# Patient Record
Sex: Male | Born: 1945 | Race: White | Hispanic: No | Marital: Married | Smoking: Never smoker
Health system: Southern US, Community
[De-identification: ages and names within clinical notes are randomized; demographics above are authoritative.]

## PROBLEM LIST (undated history)

## (undated) DIAGNOSIS — K5790 Diverticulosis of intestine, part unspecified, without perforation or abscess without bleeding: Secondary | ICD-10-CM

## (undated) DIAGNOSIS — I872 Venous insufficiency (chronic) (peripheral): Secondary | ICD-10-CM

## (undated) DIAGNOSIS — M51369 Other intervertebral disc degeneration, lumbar region without mention of lumbar back pain or lower extremity pain: Secondary | ICD-10-CM

## (undated) DIAGNOSIS — E781 Pure hyperglyceridemia: Secondary | ICD-10-CM

## (undated) DIAGNOSIS — N529 Male erectile dysfunction, unspecified: Secondary | ICD-10-CM

## (undated) DIAGNOSIS — Z9989 Dependence on other enabling machines and devices: Secondary | ICD-10-CM

## (undated) DIAGNOSIS — J9600 Acute respiratory failure, unspecified whether with hypoxia or hypercapnia: Secondary | ICD-10-CM

## (undated) DIAGNOSIS — G4733 Obstructive sleep apnea (adult) (pediatric): Secondary | ICD-10-CM

## (undated) DIAGNOSIS — U071 COVID-19: Secondary | ICD-10-CM

## (undated) DIAGNOSIS — E1169 Type 2 diabetes mellitus with other specified complication: Secondary | ICD-10-CM

## (undated) DIAGNOSIS — I509 Heart failure, unspecified: Secondary | ICD-10-CM

## (undated) DIAGNOSIS — R7989 Other specified abnormal findings of blood chemistry: Secondary | ICD-10-CM

## (undated) DIAGNOSIS — M5136 Other intervertebral disc degeneration, lumbar region: Secondary | ICD-10-CM

## (undated) DIAGNOSIS — E7800 Pure hypercholesterolemia, unspecified: Secondary | ICD-10-CM

## (undated) DIAGNOSIS — E669 Obesity, unspecified: Secondary | ICD-10-CM

## (undated) DIAGNOSIS — I10 Essential (primary) hypertension: Secondary | ICD-10-CM

## (undated) DIAGNOSIS — G4730 Sleep apnea, unspecified: Secondary | ICD-10-CM

## (undated) DIAGNOSIS — K319 Disease of stomach and duodenum, unspecified: Secondary | ICD-10-CM

## (undated) HISTORY — DX: Male erectile dysfunction, unspecified: N52.9

## (undated) HISTORY — DX: Other intervertebral disc degeneration, lumbar region without mention of lumbar back pain or lower extremity pain: M51.369

## (undated) HISTORY — DX: Venous insufficiency (chronic) (peripheral): I87.2

## (undated) HISTORY — DX: Disease of stomach and duodenum, unspecified: K31.9

## (undated) HISTORY — DX: Obstructive sleep apnea (adult) (pediatric): G47.33

## (undated) HISTORY — DX: Diverticulosis of intestine, part unspecified, without perforation or abscess without bleeding: K57.90

## (undated) HISTORY — PX: TRANSTHORACIC ECHOCARDIOGRAM: SHX275

## (undated) HISTORY — PX: BACK SURGERY: SHX140

## (undated) HISTORY — DX: COVID-19: U07.1

## (undated) HISTORY — DX: Sleep apnea, unspecified: G47.30

## (undated) HISTORY — DX: Acute respiratory failure, unspecified whether with hypoxia or hypercapnia: J96.00

## (undated) HISTORY — DX: Other intervertebral disc degeneration, lumbar region: M51.36

## (undated) HISTORY — DX: Pure hypercholesterolemia, unspecified: E78.00

## (undated) HISTORY — DX: Essential (primary) hypertension: I10

## (undated) HISTORY — DX: Other specified abnormal findings of blood chemistry: R79.89

## (undated) HISTORY — DX: Pure hyperglyceridemia: E78.1

## (undated) HISTORY — DX: Dependence on other enabling machines and devices: Z99.89

## (undated) HISTORY — DX: Type 2 diabetes mellitus with other specified complication: E11.69

## (undated) HISTORY — DX: Obesity, unspecified: E66.9

---

## 1997-06-12 ENCOUNTER — Encounter (INDEPENDENT_AMBULATORY_CARE_PROVIDER_SITE_OTHER): Payer: Self-pay | Admitting: Gastroenterology

## 1998-05-06 ENCOUNTER — Observation Stay (HOSPITAL_COMMUNITY): Admission: AD | Admit: 1998-05-06 | Discharge: 1998-05-08 | Payer: Self-pay | Admitting: Cardiology

## 2001-03-07 ENCOUNTER — Encounter: Payer: Self-pay | Admitting: Cardiovascular Disease

## 2001-03-07 ENCOUNTER — Ambulatory Visit (HOSPITAL_COMMUNITY): Admission: RE | Admit: 2001-03-07 | Discharge: 2001-03-07 | Payer: Self-pay | Admitting: Cardiovascular Disease

## 2001-10-16 HISTORY — PX: CARDIAC CATHETERIZATION: SHX172

## 2002-07-04 ENCOUNTER — Ambulatory Visit (HOSPITAL_COMMUNITY): Admission: RE | Admit: 2002-07-04 | Discharge: 2002-07-04 | Payer: Self-pay | Admitting: Cardiology

## 2002-08-26 ENCOUNTER — Encounter: Admission: RE | Admit: 2002-08-26 | Discharge: 2002-08-26 | Payer: Self-pay | Admitting: Specialist

## 2002-08-26 ENCOUNTER — Encounter: Payer: Self-pay | Admitting: Specialist

## 2002-09-15 ENCOUNTER — Encounter: Payer: Self-pay | Admitting: Specialist

## 2002-09-17 ENCOUNTER — Encounter: Payer: Self-pay | Admitting: Specialist

## 2002-09-17 ENCOUNTER — Inpatient Hospital Stay (HOSPITAL_COMMUNITY): Admission: RE | Admit: 2002-09-17 | Discharge: 2002-09-20 | Payer: Self-pay | Admitting: Specialist

## 2003-12-23 ENCOUNTER — Emergency Department (HOSPITAL_COMMUNITY): Admission: EM | Admit: 2003-12-23 | Discharge: 2003-12-23 | Payer: Self-pay

## 2003-12-28 ENCOUNTER — Emergency Department (HOSPITAL_COMMUNITY): Admission: EM | Admit: 2003-12-28 | Discharge: 2003-12-28 | Payer: Self-pay | Admitting: Emergency Medicine

## 2004-06-14 IMAGING — CT CT L SPINE W/ CM
2 series · 10 of 14 positions shown, 12 images · IV contrast (omnipaque)
Comparison: none

FINDINGS
CLINICAL DATA: PATIENT WITH LOW BACK PAIN, NUMBNESS IN HIS BUTTOCKS AND PENIS REGION.
LUMBAR MYELOGRAM
INFORMED CONSENT WAS OBTAINED.  USING STERILE TECHNIQUE, I PLACED A 20 GAUGE SPINAL NEEDLE INTO THE
THECAL SAC AT L2.  CSF IS CLEAR.  15 CC OMNIPAQUE 180 CONTRAST WAS INSTILLED.  THE NEEDLE WAS
WITHDRAWN.  MULTIPLE MYELOGRAM IMAGES WERE OBTAINED, SUPPLEMENTED WITH STANDING FLEXION AND
EXTENSION VIEWS, AS WELL AS LATERAL BENDING VIEWS.
THE PATIENT HAS FIVE NON RIB BEARING LUMBAR VERTEBRA.  MULTI LEVEL DEGENERATIVE DISK DISEASE IS
DEMONSTRATED.  AT L2-3, CENTRAL STENOSIS IS DEMONSTRATED WITH POSTERIOR OSSEOUS SPURRING.  THE
STENOSIS DOES DECREASE SLIGHTLY WITH FORWARD FLEXION.  THERE IS SUPERIOR STENOSIS AT L3-4.  THERE
WAS MINIMAL CONTRAST, WHICH EXTENDED CAUDAL TO THE L3-4 DISK LEVEL.  NO APPRECIABLE CONTRAST IS
SEEN CAUDAL TO L4.  ADVANCED DEGENERATIVE DISK DISEASE IS SEEN AT THE L4-5 LEVEL WITH DISK HEIGHT
LOSS AND SUBCHONDRAL SCLEROSIS.  THERE IS NO APPRECIABLE MOTION THROUGH FLEXION AND EXTENSION.
MARKED DISK HEIGHT LOSS IS SEEN AT L5-S1.
IMPRESSION
PATIENT WITH SEVERE SPINAL STENOSIS AT L2-3 AND L3-4.  NO APPRECIABLE CONTRAST EXTENDS CAUDAL TO
THE L3-4 LEVEL.  STENOSIS AT L2-3 DOES REDUCE SLIGHTLY WITH FLEXION.
CT LUMBAR SPINE POST INTRATHECAL CONTRAST
COMPARE MRI EXAMINATION FROM [HOSPITAL] 08/08/02.
AXIAL IMAGING EXTENDS FROM L1 THROUGH S1.
L1-2:  NORMAL.
L2-3:  DIFFUSE CIRCUMFERENTIAL DISK BULGE WITH BROAD CENTRAL PROTRUSION ECCENTRIC RIGHT.  THIS IN
CONJUNCTION WITH FACET OSTEOARTHRITIS AND  LIGAMENTUM FLAVUM HYPERTROPHY RESULTS IN MODERATE
MULTIFACTORIAL CENTRAL STENOSIS.  NO APPRECIABLE ENCROACHMENT UPON EXITING L2 DORSAL ROOT GANGLIA.
L3-4:  DIFFUSE CIRCUMFERENTIAL DISK BULGE WITH BROAD CENTRAL DISK PROTRUSION.  THIS IN CONJUNCTION
WITH FACET OSTEOARTHRITIS LIGAMENTUM FLAVUM HYPERTROPHY RESULTS IN SEVERE MULTIFACTORIAL CENTRAL
STENOSIS.  NO APPRECIABLE SUBARTICULAR RECESS STENOSIS.  THE L3 DORSAL ROOT GANGLIA EXIT WITHOUT
ENCROACHMENT.
L4-5:  CRITICAL CENTRAL STENOSIS IS DEMONSTRATED.  THERE IS DIFFUSE DISK BULGE WITH BROAD CENTRAL
DISK EXTRUSION AND CENTRAL OSSEOUS SPURRING.  ADDITIONALLY, THERE IS ADVANCED FACET OSTEOARTHRITIS
IN THE SUPERIOR FACET BONEY OVERGROWTH RESULTING IN SEVERE SUBARTICULAR RECESS STENOSIS.  FORAMINAL
STENOSIS SEEN BILATERALLY, WHICH IS MULTIFACTORIAL, COMPOUNDED BY NARROWING OF THE FORAMEN IN THE
CRANIOCAUDAL DIMENSION IS THE RESULT OF DISK HEIGHT LOSS.
L5-S1:  MODERATE SEVERE FORAMINAL STENOSIS BILATERALLY SECONDARY TO NARROWING IN THE FORAMEN AND
THE CRANIOCAUDAL DIMENSION, COMPOUNDED BY SUPRA FACET BONEY OVERGROWTH AND DIFFUSED DISK BULGE.
ADDITIONALLY, THERE IS MODERATE TO SEVERE SUBARTICULAR RECESS STENOSIS BILATERALLY, RIGHT GREATER
THAN LEFT.  NO APPRECIABLE CENTRAL STENOSIS.

[Series 3: axials · axial · 0.29mm/px · z∈[-60,+76]mm · 5 of 74 slices shown, 7 images (1 of 2)]
[im 13/74  soft-tissue]
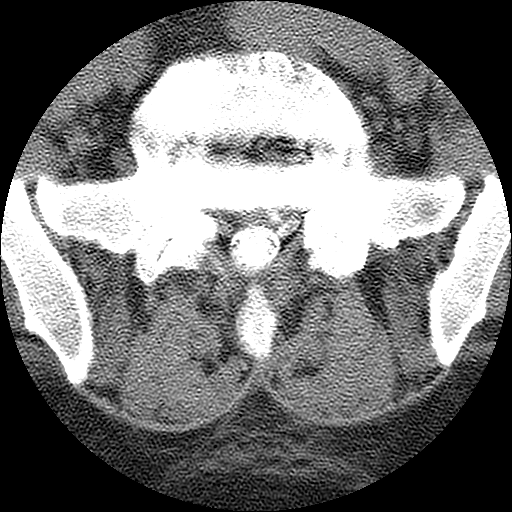
[im 13/74  bone]
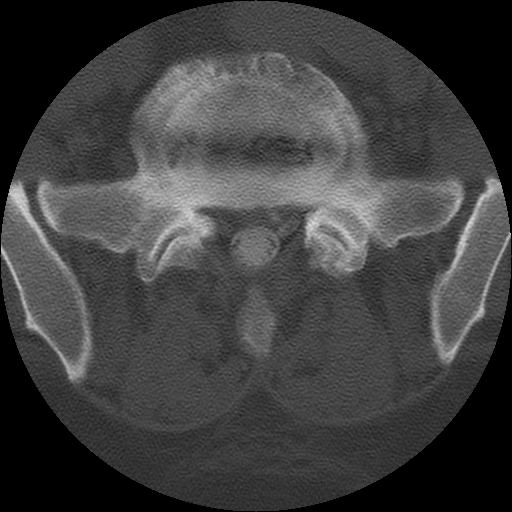
[im 25/74  bone]
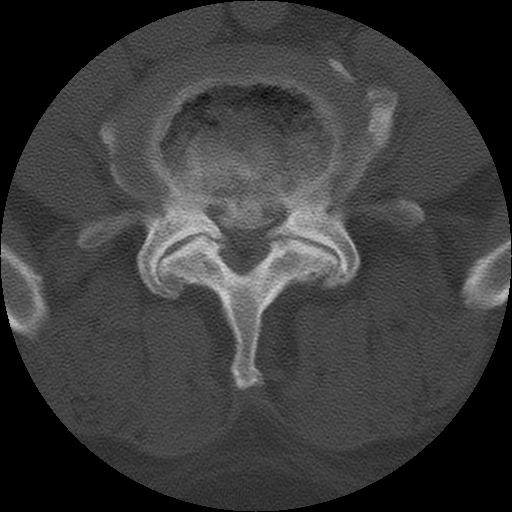
[im 37/74  bone]
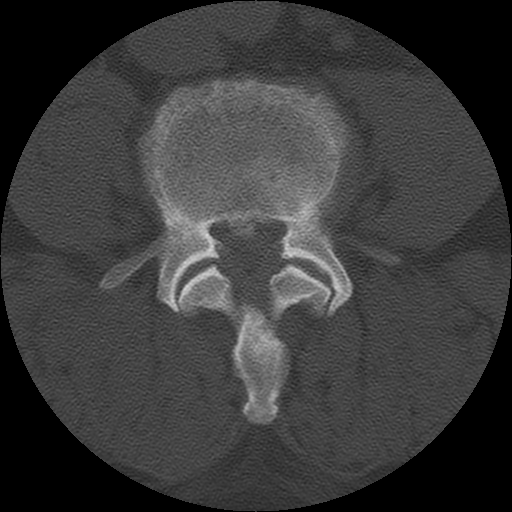
[im 49/74  bone]
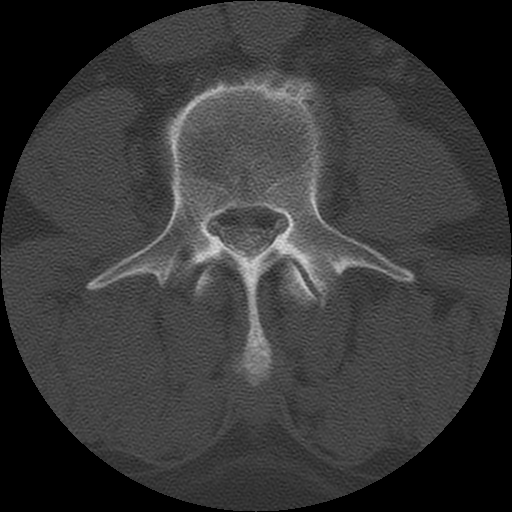
[im 61/74  soft-tissue]
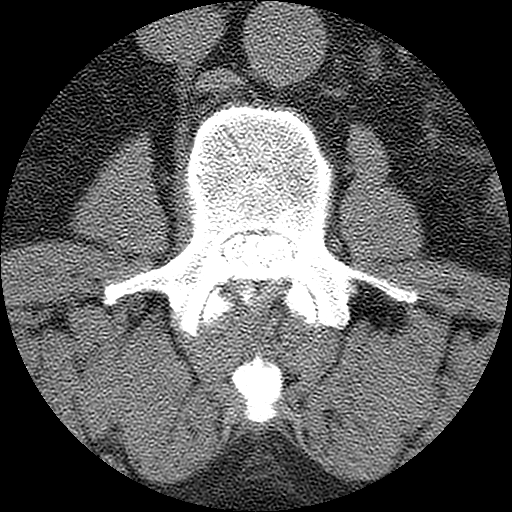
[im 61/74  bone]
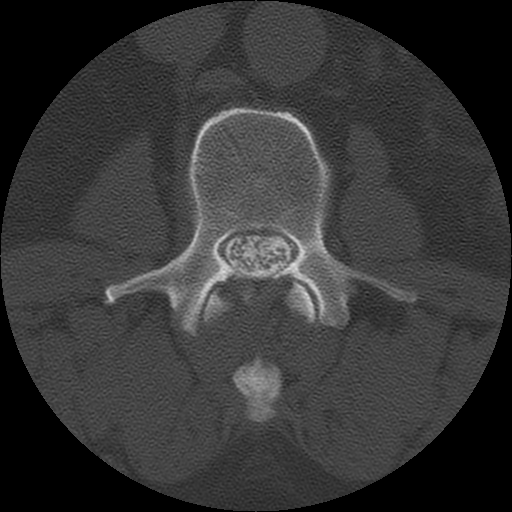

[Series 4: axials · axial · 0.29mm/px · z∈[-60,+76]mm · 5 of 74 slices shown (2 of 2)]
[im 13/74  bone]
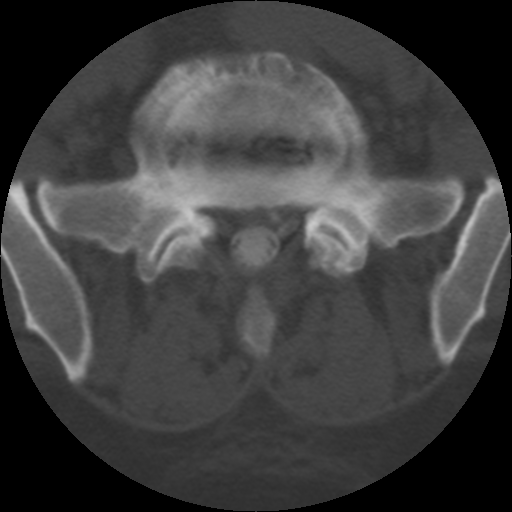
[im 25/74  bone]
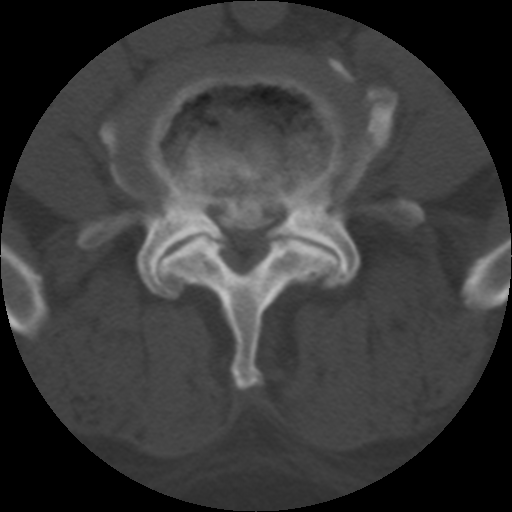
[im 37/74  bone]
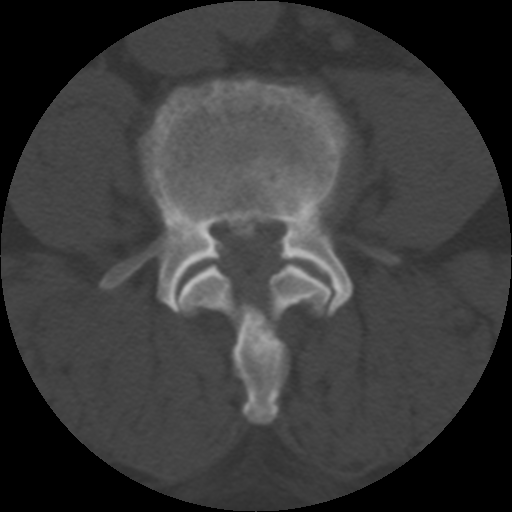
[im 49/74  bone]
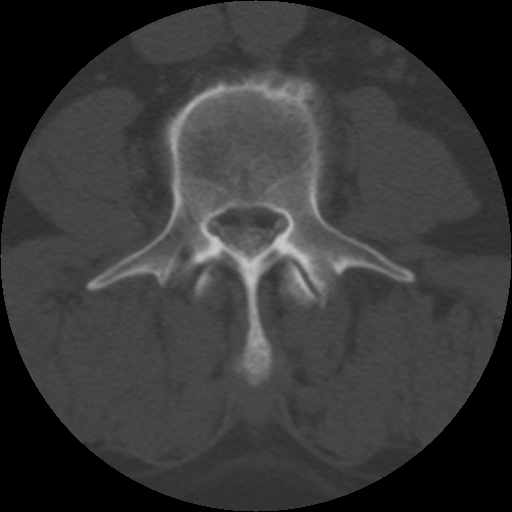
[im 61/74  bone]
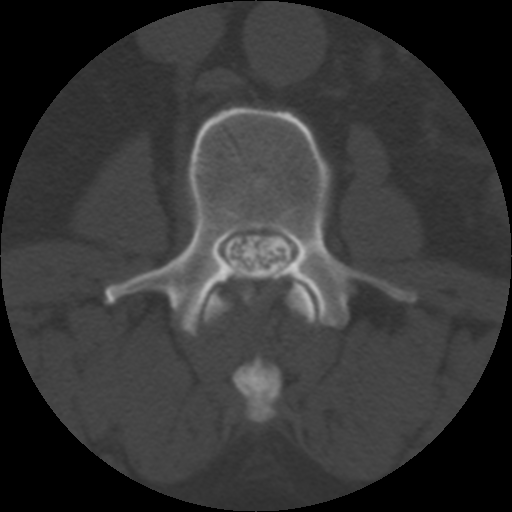

[10 of 14 positions shown; findings below may reference images not displayed]

IMPRESSION: 1)  THE PATIENT HAS MULTILEVEL DEGENERATIVE CHANGES AS DESCRIBED.
2)  L4-5 CRITICAL CENTRAL STENOSIS, MULTIFACTORIAL IN ETIOLOGY.  ASSOCIATED FORAMINAL STENOSIS
BILATERALLY WITH SUBARTICULAR RECESS STENOSIS.
3)  L3-4 MODERATE MULTIFACTORIAL CENTRAL STENOSIS.
4)  L5-S1 FORAMINAL STENOSIS BILATERALLY WITH SUBARTICULAR RECESS STENOSIS, RIGHT GREATER THAN LEFT.

## 2005-01-23 ENCOUNTER — Ambulatory Visit: Payer: Self-pay | Admitting: Cardiology

## 2005-02-03 ENCOUNTER — Ambulatory Visit: Payer: Self-pay | Admitting: Cardiology

## 2007-05-27 ENCOUNTER — Ambulatory Visit: Payer: Self-pay | Admitting: Cardiology

## 2007-05-27 LAB — CONVERTED CEMR LAB
AST: 27 units/L (ref 0–37)
Albumin: 3.9 g/dL (ref 3.5–5.2)
Alkaline Phosphatase: 49 units/L (ref 39–117)
BUN: 15 mg/dL (ref 6–23)
Bilirubin, Direct: 0.1 mg/dL (ref 0.0–0.3)
Cholesterol: 160 mg/dL (ref 0–200)
GFR calc Af Amer: 110 mL/min
GFR calc non Af Amer: 91 mL/min
HDL: 35.1 mg/dL — ABNORMAL LOW (ref >39.0)
Total Bilirubin: 0.8 mg/dL (ref 0.3–1.2)
Total Protein: 6.6 g/dL (ref 6.0–8.3)
Triglycerides: 144 mg/dL (ref 0–149)

## 2008-06-05 ENCOUNTER — Encounter: Admission: RE | Admit: 2008-06-05 | Discharge: 2008-06-05 | Payer: Self-pay | Admitting: Specialist

## 2008-06-18 ENCOUNTER — Ambulatory Visit: Payer: Self-pay | Admitting: Cardiology

## 2008-06-19 ENCOUNTER — Ambulatory Visit: Payer: Self-pay

## 2008-07-02 ENCOUNTER — Telehealth: Payer: Self-pay | Admitting: Gastroenterology

## 2008-07-09 ENCOUNTER — Ambulatory Visit: Payer: Self-pay | Admitting: Gastroenterology

## 2008-07-13 ENCOUNTER — Ambulatory Visit: Payer: Self-pay | Admitting: Gastroenterology

## 2008-07-22 ENCOUNTER — Inpatient Hospital Stay (HOSPITAL_COMMUNITY): Admission: RE | Admit: 2008-07-22 | Discharge: 2008-07-24 | Payer: Self-pay | Admitting: Specialist

## 2010-02-08 ENCOUNTER — Telehealth: Payer: Self-pay | Admitting: Gastroenterology

## 2010-11-17 NOTE — Progress Notes (Signed)
Summary: Schedule recall colonoscopy  Phone Note Outgoing Call Call back at Home Phone [phone]   Call placed by: Christie Nottingham CMA Duncan Dull),  February 08, 2010 2:32 PM Call placed to: Patient Summary of Call: left message for pt  to call back and schedule recall colonoscopy Initial call taken by: Christie Nottingham CMA Duncan Dull),  February 08, 2010 2:32 PM  Follow-up for Phone Call        pt called back and states he does not wish to schedule at this time.  Follow-up by: Christie Nottingham CMA Duncan Dull),  February 10, 2010 4:25 PM

## 2011-02-28 NOTE — H&P (Signed)
NAME:  Ethan Goodman, Ethan Goodman NO.:  [id number]   MEDICAL RECORD NO.:  [id number]          PATIENT TYPE:  INP   LOCATION:  NA                           FACILITY:  [facility]   PHYSICIAN:  Jene Every, M.D.    DATE OF BIRTH:  22-Sep-1946   DATE OF ADMISSION:  07/22/2008  DATE OF DISCHARGE:                              HISTORY & PHYSICAL   CHIEF COMPLAINT:  Left buttock and hip pain as well as leg weakness.   HISTORY:  Ethan Goodman is well known to our practice.  He has a  longstanding history of low back problems.  He has recently developed  worsening of his back and lower extremity pain.  Myelogram studies do  indicate severe stenosis at 3-4 as well as disk herniation at 2-3.  The  patient did undergo a cortisone injection with some short-term relief of  his symptoms, but it is felt he would benefit from a lumbar  decompression.  The risks and benefits of this were discussed with the  patient.  He does elect to proceed.   MEDICAL HISTORY:  Diet-controlled diabetes, hypertension.   CURRENT MEDICATIONS:  1. Benazepril 20 mg 1 p.o. daily.  2. Robaxin 750 mg 1 p.o. p.r.n.  3. Cardura 4 mg 1 p.o. daily.  4. Norvasc 5 mg 1 p.o. daily.  5. Oxycodone 7.5 mg 1 p.o. p.r.n.  6. Vitamins.   ALLERGIES:  Include ADHESIVE TAPE, ATIVAN.   PREVIOUS SURGERY:  Hernia repair, lumbar decompression in 2003.   SOCIAL HISTORY:  The patient is married.  He is a Education officer, environmental.  History  negative for tobacco or alcohol.   PRIMARY CARE PHYSICIAN:  Dr. Riley Goodman.   FAMILY HISTORY:  Mother and brother are both deceased from coronary  artery disease as well as diabetes.   REVIEW OF SYSTEMS:  GENERAL:  The patient denies any fever, chills,  night sweats or bleeding tendencies.  CNS:  No blurred or double vision,  seizure, headache or paralysis.  RESPIRATORY:  The patient does note  hoarseness.  He recently had negative endoscopy.  No shortness of breath  or hemoptysis.  CARDIOVASCULAR:  No chest pain,  angina or orthopnea.  GU:  No dysuria, hematuria or discharge.  GI:  No nausea, vomiting,  diarrhea, constipation, melena or bloody stools.  MUSCULOSKELETAL:  As  per HPI.   HISTORY OF PRESENT ILLNESS:  CONSTITUTIONAL:  This is an overweight  gentleman sitting upright in mild distress.  VITAL SIGNS:  BP is 140/90, respiratory rate 10.  Pulse is 76.  HEENT: Atraumatic, normocephalic.  Pupils equal round and reactive to  light.  EOMs intact.  NECK:  Supple.  No lymphadenopathy.  CHEST:  Clear to auscultation bilaterally.  No rhonchi, wheezes or  rales.  BREASTS AND GU:  Not examined, not pertinent to HPI.  HEART:  Regular rate and rhythm without murmurs, gallops or rubs.  ABDOMEN:  Protuberant, soft, nontender.  Bowel sounds x4.  SKIN:  No rashes or lesions are noted.  BACK:  The patient does have pain with forward flexion and extension.  He does have pain  with femoral stretch.  He does have some hip flexor  weakness, positive straight leg raise as well.   IMPRESSION:  Severe stenosis L2-3 and L3-4.   PLAN:  The patient will be admitted to undergo central decompression at  L2-3 and L3-4.      Ethan Goodman, P.A.      Jene Every, M.D.  Electronically Signed    CS/MEDQ  D:  07/20/2008  T:  07/20/2008  Job:  [id number]

## 2011-02-28 NOTE — Op Note (Signed)
NAME:  Ethan Goodman, Ethan Goodman NO.:  [id number]   MEDICAL RECORD NO.:  [id number]          PATIENT TYPE:  INP   LOCATION:  0007                         FACILITY:  [facility]   PHYSICIAN:  Jene Every, M.D.    DATE OF BIRTH:  [birth year]   DATE OF PROCEDURE:  07/22/2008  DATE OF DISCHARGE:                               OPERATIVE REPORT   PREOPERATIVE DIAGNOSIS:  Spinal stenosis L3-4, L2-3.   POSTOPERATIVE DIAGNOSIS:  Spinal stenosis L3-4, L2-3.   PROCEDURE:  Redo central decompression at L3-4, L2-3, foraminotomies L3-  L4 left L3 right L4 right.   ANESTHESIA.:  General.   SURGEON:  Jene Every, M.D.   ASSISTANT:  [name], P.A.   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  He had recurrent spinal stenosis after doing well for a  period of time, particularly at 3-4 which was noted to be severe by  myelogram.  He had been refractory to conservative treatment.  Due to  the severity of that he is indicated for redo decompression at 3-4 and  at 2-3.  He had neural foraminal stenosis at 2-3.  He had predominantly  left-sided symptoms, weakness in his quad and his hip flexors.  Risks  and benefits were discussed including bleeding, infection, injury to  neurovascular structures, CSF leakage, epidural fibrosis, adjacent  segment disease, need for fusion in the future, anesthetic  complications, no change in symptoms, worsening symptoms, etc.   TECHNIQUE:  With the patient in supine position after induction of  adequate general anesthesia and 2 grams Kefzol he was placed prone on  the [place] frame.  All bony prominences well-padded.  Lumbar region was  prepped and draped in the usual sterile fashion.  Incision was made from  the palpable spinous process of L2 distal utilizing the previous  incision.  Subcutaneous tissue was dissected.  Electrocautery utilized  to achieve hemostasis.  We skeletonized the two lamina.  We determined  our depth, identified the facets at 2-3 and utilized a curette to  skeletonize the facets of 2-3 and at 3-4.  Fibrotic and epidural  fibrosis was removed laterally and dorsally.  I used an Psychologist, sport and exercise.  We obtained further radiographs to determine our level.  We  felt the constriction was mainly from the pedicle of 3-4.  We entered  with a straight curette at the level of the previous laminotomy at 2 on  the left.  We enlarged the laminotomy and decompressed the lateral  recess.  This was 2-3 and the pedicle of 3 on the left.  The foramen of  2 was fairly stenotic.  Foraminotomy were performed here because of  significant pressure on the root that was noted.  We continued the  decompression laterally.  There was exuberant epidural fibrosis  impression in the lateral recess decompressing the canal to the medial  border of the pedicle.  There was no evidence of disk herniation.  We  performed a foraminotomy at 4 with  severe constriction noted at 3-4 from  the pedicle of 3-4.  In a similar fashion attention was turned to the  right and in a similar fashion we skeletonized the previous laminotomy  at 2-3 and 3-4 and decompressed the lateral recess to the medial border  of the pedicle protecting the thecal sac at all times.  Again, stenosis  was noted at the foramen of 2.  Foraminotomy was performed.  Stenosis  was noted at the foramen of 3.  Foraminotomies were performed here as  well.  Hypertrophic synovitis was noted from the facet, synovial cyst on  the right which was excised.  There was exuberant scar tissue encasing  the both L2 nerve roots bilaterally.  I did however feel that after the  decompression would improve significantly the foramen of 3, 4, the  lateral recesses and the foramen of 2.  Following this confirmatory  radiograph confirmed this down to the below the level of stenosis.  Wound copiously  irrigated.  Bone wax was placed in the cancellous  surfaces.  Copious irrigation.  There was some gentle mild bleeding that  we cauterized.  We felt that Hemovac would be appropriate and we brought  it out through a lateral stab wound on the skin.  McCullough retractor  was removed.  Paraspinous muscle inspected and no evidence of active  bleeding.  Dorsolumbar fascia reapproximated with #1 Vicryl interrupted  figure-of-eight sutures.  Subcutaneous tissue reapproximated with 0 and  2-0 Vicryl simple sutures.  Skin reapproximated with staples and wound  dressed sterilely.  He was placed supine on the hospital bed, extubated  without difficulty and transported to recovery in satisfactory  condition.  The patient tolerated the procedure with no complications.  Blood loss was approximately 200 mL.      Jene Every, M.D.  Electronically Signed     JB/MEDQ  D:  07/22/2008  T:  07/23/2008  Job:  [id number]

## 2011-02-28 NOTE — Assessment & Plan Note (Signed)
[facility]                            CARDIOLOGY OFFICE NOTE   Ethan Goodman, Ethan Goodman                      MRN:          [id number]  DATE:05/27/2007                            DOB:          Aug 04, 1946    Ethan Goodman is in for a follow up visit. He generally has been  stable. He has not been having any major difficulties at the present  time. His weight does continue to be a problem although he has lost a  fair amount of weight and this actually substantially improved.   MEDICATIONS:  1. Benazepril 20 mg daily.  2. Doxazosin 4 mg daily.  3. Vitamins.  4. Flaxseed oil.   PHYSICAL EXAMINATION:  VITAL SIGNS:  Blood pressure 142/100 and then  repeat 130/90, pulse 56.  LUNGS:  Fields are clear.  CARDIAC:  Rhythm is regular.   The EKG reveals sinus bradycardia with left axis deviation and minor non-  specific T-wave flattening.   IMPRESSION:  1. Mild coronary artery disease as described in cath report 07/04/2002.  2. Hypertension.  3. Moderate obesity.  4. Mild hypercholesterolemia.   PLAN:  1. Continue encouragement of weight loss.  2. Check lipid and liver profile.  3. Return to clinic in three months for a repeat check of weight,      blood pressure, and re-evaluation of lipids.     Arturo Morton. Riley Kill, MD, [facility]  Electronically Signed    TDS/MedQ  DD: 07/28/2007  DT: 07/28/2007  Job #: [id number]

## 2011-02-28 NOTE — Assessment & Plan Note (Signed)
[facility]                            CARDIOLOGY OFFICE NOTE   Ethan Goodman, Ethan Goodman                      MRN:          [id number]  DATE:06/18/2008                            DOB:          1946-08-01    Ethan Goodman is in for followup visit.  Importantly, he needs to have  back surgery by Dr. August Saucer.  He has not been having any ongoing chest pain  or progressive shortness of breath.  With regard to the back surgery,  the patient is pretty symptomatic, and feels fairly like he needs to  have something done.  Importantly, the patient underwent cardiac  catheterization previously.  This demonstrated a 30% narrowing in the  proximal right and 20% distally in the right.  There was a 40% ostial  left main stenosis previously described by Dr. Gerri Spore in 1999 is 50%.  This has not significantly changed.  Exercise Cardiolite study in the  past has not revealed significant ischemia, but the last time that this  was really done was in July 1999.  There is a question of inferior  thinning at that time by radionuclide imaging.  Otherwise, the patient  has gotten along reasonably well.  The patient does have moderate  obesity and hypertension.   ALLERGIES:  Questionable ATIVAN.   MEDICATIONS:  1. Doxazosin 4 mg nightly.  2. Norvasc 5 mg daily.  3. Robaxin 750 mg t.i.d.  4. Benazepril 20 mg p.o. b.i.d.  5. Fish oil and garlic tablets.  The patient has not been on      antiplatelet agents, and he has not been on statins.   On laboratory studies done here in 2008 did reveal normal renal function  with borderline glucose.  His HDL was 35 and his LDL was 96.  He has had  a hemoglobin A1c of 7.7 and mild elevation in his liver function  studies.  Importantly, the patient has had prior radionuclide imaging  study also in 2002 with EF of 49%.   Prior to July, the patient was walking 3 miles a day without major  symptoms.   On physical today, blood pressure is  140/98, pulse is 70.  The lung  fields are clear.  The cardiac rhythm is regular.  I do not appreciate a  significant murmur.   Electrocardiogram demonstrates normal sinus rhythm.  There is a leftward  oriented axis.  There is a delay in R-wave progression due to axis  shift.   IMPRESSION:  1. Prior coronary artery disease with about 40 and then 50% left main      narrowing noted in 1999 and in 2003.  2. Hypertension with modest control.  3. Moderate obesity.  4. Mild hypercholesterolemia.  5. History of mild liver function abnormalities.   PLAN:  The patient is scheduled to have fairly major surgery.  His  functional status is such that he cannot achieve 4 minutes of activity  at the current rate.  He has had defined coronary artery disease and no  prior evaluation within the past 5 years.  He has had known left main  stenosis of 40% to 50% previously.  He has had prior radionuclide  imaging study.  Based on this, I think that adenosine Myoview would  probably be relatively important under this circumstance.  In general,  he is not symptomatic, but with a low workload, previously defined  coronary artery disease, and a moderately high risk operation,  understanding that his myocardial perfusion imaging is relatively normal  would be helpful prior to surgery.  I have explained this to the patient  and his family in detail.  He is agreeable.     Arturo Morton. Riley Kill, MD, [facility]  Electronically Signed    TDS/MedQ  DD: 06/18/2008  DT: 06/19/2008  Job #: [id number]

## 2011-03-03 NOTE — Cardiovascular Report (Signed)
NAME:  Ethan Goodman, Ethan Goodman NO.:  [id number]   MEDICAL RECORD NO.:  [id number]                   PATIENT TYPE:  OIB   LOCATION:  2856                                 FACILITY:  [facility]   PHYSICIAN:  Salvadore Farber, M.D. [facility]         DATE OF BIRTH:  06/18/1946   DATE OF PROCEDURE:  07/04/2002  DATE OF DISCHARGE:                              CARDIAC CATHETERIZATION   PROCEDURES:  Left heart catheterization, left ventriculography, coronary  angiography.   INDICATIONS:  The patient is a 65 year old gentleman with previously  documented coronary disease, who had recurrent rather atypical chest pain.  He was referred for diagnostic angiography.   DIAGNOSTIC TECHNIQUE:  Informed consent was obtained. Under 2% lidocaine  local anesthesia, a 6 French sheath was placed in the right femoral artery  using the modified Seldinger technique. Angiography was performed using JL4  and JR4 catheters.  A pigtail catheter was used to perform ventriculography.  LV pressures were measured. Following the procedure, the patient was  transferred to the holding room in stable condition.  ____________ to be  removed there.   COMPLICATIONS:  None.   FINDINGS:  1. Left main:  There was an ostial 40% stenosis angiographically suggestive     of kinking rather than atherosclerotic in origin, though this cannot be     stated with certainty.  2. LAD:  The LAD is a very large vessel giving rise to two large diagonal     branches.  There is a mild luminal irregularity of the mid vessel.  3. Circumflex:  The circumflex is a codominant vessel giving rise to three     obtuse marginal branches and  a PDA. The vessel is angiographically     normal.  4. RCA:  The RCA is a very large vessel in both territory and diameter. The     diameter approaches 10 mm.  There is a 30% stenosis of the proximal     vessel and a 20% stenosis of the PLV.  There is a section of non laminar     flow in the  distal RCA proximal to the takeoff of the PDA. This occurs     just after a bend and is suggestive of poor filling of a portion of the     vessel due to non laminar flow in a very large vessel.    IMPRESSION/PLAN:  Moderate stenosis of the left anterior descending, more  suggestive of kinking and atherosclerosis.  Mild stenoses of the very large  diameter right coronary artery. These are not responsible for his chest  pain.  We will therefore plan on discharge home.  Further evaluation of his  noncardiac chest pain can be obtained should it recur.  Salvadore Farber, M.D. [facility]    WED/MEDQ  D:  07/04/2002  T:  07/07/2002  Job:  [id number]   cc:   Arturo Morton. Riley Kill, M.D. [facility]   Sean A. Everardo All, M.D. [facility]

## 2011-03-03 NOTE — Letter (Signed)
June 24, 2008    Jene Every, M.D.  [address]  [place], [place] [address]   RE:  [name], [name]  MRN:  [id number]  /  DOB:  04/24/1946   Dear Trey Paula,   [name] underwent radionuclide imaging.  He has had previous cardiac  catheterization in 1999 with a nonobstructive left main stenosis and  mild coronary irregularities of the right coronary artery.  He had a  previous radionuclide imaging study in 1999.  He underwent radionuclide  imaging this week, and this demonstrated an ejection fraction of 51%,  mild left ventricular enlargement, but apical thinning and no ischemia.   Based on this, I think it would be reasonable to proceed.  I suspect his  left ventricular enlargement is in part related to his long-standing  hypertension.  It is under moderate control.  Intravenous fluids during  surgery will need to be monitored carefully.  It sounds as though from  the patient's symptoms that he does need to go ahead with surgery and  that this is not an elective procedure.  We will be available at any  time during his surgical procedure that is necessary.  I appreciate the  opportunity of sharing in his care and let us know when he is admitted.    Sincerely,      Arturo Morton. Riley Kill, MD, [facility]  Electronically Signed    TDS/MedQ  DD: 06/24/2008  DT: 06/24/2008  Job #: [id number]   CC:    Feliciana Rossetti, MD

## 2011-03-03 NOTE — H&P (Signed)
NAME:  Ethan Goodman, MCCABE                      ACCOUNT NO.:  [id number]   MEDICAL RECORD NO.:  [id number]                    PATIENT TYPE:   LOCATION:                                       FACILITY:   PHYSICIAN:  Jene Every, M.D.                 DATE OF BIRTH:  23-Feb-1946   DATE OF ADMISSION:  09/17/2002  DATE OF DISCHARGE:                                HISTORY & PHYSICAL   CHIEF COMPLAINT:  Low back pain, also burning into the right groin and into  the buttocks with intermittent numbness.   HISTORY OF PRESENT ILLNESS:  The patient is a 65 year old gentleman who in  early October presented to our office with onset of low back pain and  burning into the right groin and buttocks area.  The patient denied any  injury at that time.  The pain is significantly worse when standing, is  relieved with sitting or lying down.  Initially, the patient had no problems  with bowel or bladder function.  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  On initial  exam, the patient had positive straight leg raise on the right, an EHL of 5  minus 5 on the right.  The patient followed up in our office.  Performed an  MRI of his lumbar spine which demonstrated a moderate to severe spinal  stenosis particularly L4-5 with a small eccentric disk herniation at L3-4  paraspinous to the left.  There is also associated disk stenosis of L2-3,  bulging in the L5-S1, and therefore foraminal narrowing L5-S1.  Also at that  time, the patient did note periodic numbness in the rectum although rectal  exam was unremarkable.  Due to the patient's continued symptoms, an  myelogram CT of his lumbar spine was obtained.  Myelogram showed severe  spinal stenosis of L2-3 and L3-4 where the _______ contrast extends caudal  to the L3-4 level,  stenosis at L2-3 does reduce slightly with flexion.  CT  results showed that the patient has multilevel degenerative changes.   Also  L4-5 critical central stenosis, multifactorial in etiology.  Also associated  foraminal stenosis bilaterally, subarticular recess stenosis to L3-4,  moderate multifactorial central stenosis.  Also L5-S1 foraminal stenosis  bilaterally.  Given the severity of the patient's spinal stenosis and his  current symptomology, it is felt that he would benefit from a decompression  of his L-spine.  The risks and benefits of the surgery were discussed with  the patient by Dr. Jillyn Hidden.  The patient elected to proceed with decompression  at L3-4, L4-5, L5-S1, and possibly L2-3.   PAST MEDICAL HISTORY:  1. Hypertension.  2. Borderline diabetes.  3. Coronary artery disease.  4. Osteoarthritis.  5. Degenerative joint disease.   PAST SURGICAL HISTORY:  1. Left inguinal hernia repair in 1980.  2. 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  3. Cardiac catheterization.   CURRENT MEDICATIONS:  1. Lotensin 10 mg one p.o. q.d.  2. Cardura 4 mg one p.o. q.d.   ALLERGIES:  CODEINE causes hallucinations and paranoid.   SOCIAL HISTORY:  The patient is married.  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  He denies any tobacco or alcohol use.  He lives in a one-story home  with his wife who will be his caregiver following surgery.   FAMILY HISTORY:  Significant for coronary artery disease, diabetes mellitus,  congestive heart failure, hypertension, osteoarthritis.   REVIEW OF SYSTEMS:  GENERAL:  No fever, chills, night sweats, bleeding  tendency.  CNS:  No blurred or double vision, seizure, headache, or  paralysis.  RESPIRATORY:  No shortness of breath, cough, or hemoptysis.  CARDIOVASCULAR:  No chest pain, angina, or orthopnea.  GI:  No nausea,  vomiting, diarrhea, constipation, melena, or bloody stools.  GU:  No  dysuria, hematuria, or discharge.  MUSCULOSKELETAL:  As pertinent in the  HPI.   PHYSICAL EXAMINATION:  VITAL SIGNS:  Pulse 88, respirations 20.  GENERAL:  This is a  well-developed, well-nourished 65 year old gentleman in  no acute distress.  HEENT:  Atraumatic, normocephalic.  Pupils equal, round, reactive to light.  EOM's intact.  NECK:  Supple, no lymphadenopathy.  No carotid bruits were auscultated.  CHEST:  Clear to auscultation bilaterally.  No rhonchi, wheezes or rales.  HEART:  Regular rate and rhythm without murmurs, gallops, or rubs.  ABDOMEN:  Soft, nontender, and nondistended.  Bowel sounds in all four.  BREASTS AND GENITOURINARY:  No examined and not pertinent to HPI.  SKIN:  There are multiple actinic keratosis lesions on bilateral upper  extremities.  Also of note, on his right ear is a actinic keratosis.  EXTREMITIES:  Extremities at this time:  Bilateral straight leg raise was  negative.  No clonus noted.  Sensation is intact to bilateral lower  extremities.  EHL is a 5 minus 5 on the right. Patient is tender to  palpation LS junction.  1+ dorsalis pedis pulses bilaterally.   IMPRESSION:  1. Severe spinal stenosis.  2. Hypertension.  3. Borderline diabetes.  4. Coronary artery disease.  5. Osteoarthritis.   PLAN:  The patient will be admitted to [facility] on September 17, 2002 to undergo a lumbar decompression L3-4, L4-5, L5-S1, and possibly L2-3  by Jene Every, M.D.  The patient's primary care physician is Dr.  Milus Glazier.  We will notify him of his admission to the hospital.     [name], P.A.                   Jene Every, M.D.    CS/MEDQ  D:  09/02/2002  T:  09/02/2002  Job:  [id number]

## 2011-03-03 NOTE — Discharge Summary (Signed)
NAME:  Ethan Goodman, Ethan Goodman NO.:  [id number]   MEDICAL RECORD NO.:  [id number]                   PATIENT TYPE:  INP   LOCATION:  0448                                 FACILITY:  [facility]   PHYSICIAN:  Jene Every, M.D.                 DATE OF BIRTH:  09-16-1946   DATE OF ADMISSION:  09/17/2002  DATE OF DISCHARGE:  09/20/2002                                 DISCHARGE SUMMARY   ADMISSION DIAGNOSES:  1. Severe spinal stenosis.  2. Hypertension.  3. Borderline diabetes.  4. Coronary artery disease.  5. Osteoarthritis.   DISCHARGE DIAGNOSES:  Severe spinal stenosis status post lumbar  decompression L2, L3, L4, L5, and S1 nerve roots with intraoperative  neurologic monitoring for three hours.   CONSULTS:  None.   OPERATIONS AND PROCEDURES:  The patient was taken to the operating room on  September 17, 2002 to undergo lumbar decompression of 2-3, L3-4, L4-5, L5-S1  with decompression of L2, L3, L4, L5, and S1 nerve roots by Jene Every,  M.D.  Assistant is Sharolyn Douglas, M.D.  Anesthesia is general.   BRIEF HISTORY:  The patient is a 65 year old gentleman who has a long-  standing history of low back pain with radicular pain into the right groin  and buttocks.  The pain has significantly gotten worse over the past several  months.  He has a previous history of HNP at L4-5 and L5 radiculopathy in  the past.  MRI of the lumbar spine shows moderate to severe spinal stenosis,  particularly L4-5 with small central disk herniation L3-4.  There is bulging  in the L5-S1 area with foraminal narrowing at L5-S1.  The patient did  complain of periodic numbness in his rectum, although rectal examination was  unremarkable.  The patient had good tone.  CT was obtained which showed  patient had multilevel degenerative changes with L4-5 critical central  stenosis multifactorial in etiology.  Given the severity of the patient's  spinal stenosis and his current symptomatology, it  was felt that patient  would benefit from decompression of multilevel L spine.  Risks and benefits  were discussed with the patient by Jene Every, M.D. and he was  subsequently admitted to [facility] to undergo the above stated  procedure.   LABORATORY VALUES:  At time of admission patient's hemoglobin was 14.6,  hematocrit 42.2.  At time of discharge this had fallen slightly hemoglobin  12.1, hematocrit 34.4.  The patient remained asymptomatic.  Recent  chemistries done preoperatively are within normal limits.  Postoperatively  the patient had slight hyponatremia at 132, elevated glucose at 166.  Urinalysis done at time of admission was negative.  The patient's blood type  is O+.  I do not see preoperative chest x-ray or EKG.   HOSPITAL COURSE:  Postoperatively the patient did well.  He had some mild  anxiety due to claustrophobia following  his surgery.  He did say that the  Dilaudid PCA was causing some nausea.  Postoperative day number one patient  did note an improvement in his bilateral lower extremity pain.  A Hemovac  drain placed at time of surgery drained 70 cc postoperative day number one.  The patient was placed on Ativan 1 mg p.r.n. anxiety for pain management.  The patient's Darvocet was discontinued and we tried Percocet p.o.  The  patient states he had tolerated this well in the past.  Postoperative day  number two the patient had flatus.  Hemovac was discontinued.  The patient's  diet was advanced.  The patient remained neurovascularly intact throughout  his hospital course.  Postoperative day number two patient's Foley and PCA  were discontinued.  IV was kept at [facility].  On postoperative day number three  the patient was doing extremely well.  His pain was well controlled on p.o.  medications and he felt like he was ready for discharge.   FOLLOW UP:  The patient was discharged home with follow-up appointment with  Jene Every, M.D. in 10-14 days.  He is to  call for an appointment.   WOUND CARE:  Daily dressing changes.  He may shower on postoperative day  number five.   DIET:  As tolerated.   ACTIVITY:  The patient can be weightbearing as tolerated with no lifting, no  pulling, sitting, standing, no bending or stooping.   DISCHARGE MEDICATIONS:  1. Percocet 5/325 one to two p.o. q.4-6h.  2. Robaxin 50 one p.o. q.6-8h. p.r.n.  3. Colace 100 mg one p.o. b.i.d.   CONDITION ON DISCHARGE:  Stable.     Ethan Goodman, P.A.                   Jene Every, M.D.    CS/MEDQ  D:  10/10/2002  T:  10/10/2002  Job:  [id number]

## 2011-03-03 NOTE — Op Note (Signed)
NAME:  Ethan Goodman, Ethan Goodman NO.:  [id number]   MEDICAL RECORD NO.:  [id number]                   PATIENT TYPE:  INP   LOCATION:  0009                                 FACILITY:  [facility]   PHYSICIAN:  Jene Every, M.D.                 DATE OF BIRTH:  1946/01/30   DATE OF PROCEDURE:  09/17/2002  DATE OF DISCHARGE:                                 OPERATIVE REPORT   PREOPERATIVE DIAGNOSIS:  Congenital spinal stenosis L2-3, 3-4, 4-5, 5-1.   POSTOPERATIVE DIAGNOSIS:  Congenital spinal stenosis L2-3, 3-4, 4-5, 5-1.   PROCEDURE PERFORMED:  Lumbar decompression of the same, decompression of the  L2, L3, L4, L5 and S1 nerve roots. Intraoperative neural monitoring for  three hours.   ANESTHESIA:  General.   ASSISTANT:  Max Noel Gerold, M.D.   BRIEF HISTORY:  This is a 65 year old with a MRI indicating severe  congenital stenosis and spinal stenosis complete block on his myelogram,  intermittent bladder difficulties. Neurogenic claudications, preoperative  intervention was indicated for decompression. The risks and benefits were  discussed including bleeding, infection, damage to neurovascular structures,  need for fusion in the future, paralysis, cauda equina syndrome, suboptimal  ________, etc.   TECHNIQUE:  The patient in the supine position after an adequate level of  general anesthesia and 1 g of Kefzol was placed prone on the Andrews frame,  all bony prominences well padded. The lumbar region was prepped and draped  in the usual sterile fashion. An incision was made from the spinous process  of S1 to L2, subcutaneous tissue was dissected, electrocautery was utilized  to achieve hemostasis. The dorsolumbar fascia identified and divided in line  with the skin incision, paraspinous muscles elevated from the lamina of the  same. Kochers were placed in the lamina of 3 and 4 confirmed with x-ray.  McCullough retractors were then placed into the wound. I exposed the  posterior lamina throughout and removed the spinous processes of 5, 4, 3 and  2. I then performed central decompression utilizing the 2 followed by a 3 mm  Kerrison. I entered the space of 5-1 first working caudad and then cephalad.  I used minimal traction throughout the case. I used intraoperative spinal  neural monitoring. We decompressed at 5-1 the foramen 5-1 centrally removing  the lamina of 5, decompressed the 4-5 which was severely stenotic centrally  and laterally with the lamina of 4 and then the ligament of flavum from 3-4  with severe central stenosis noted here as well. In a piecemeal fashion  removed the lamina of 3 and of 2 decompressing 2-3 as well. The patient had  a fair amount of oozing throughout the procedure, electrocautery was  utilized to achieve hemostasis.   Next to get out laterally, we used an osteotome protecting the thecal sac  neural elements at all times and performed a partial medial  hemifacetectomies of 5-1,  4-5, 3-4 and 2-3. Decompressing the lateral  recesses and performing foraminotomies at S1, L5, L4, L3 and L2 bilaterally.  Copiously irrigated throughout. It was  significantly stenotic particularly  at 3-4 and at 4-5. Hard disks were noted at 5-1, 4-5, 3-4 and 2-3.   Electrocautery was utilized to achieve hemostasis. I carried the  decompression out to the medial border of the pedicles bilaterally. A hockey  stick probe placed in the foramen of 2 through S1 bilaterally and found to  be widely patent throughout with some intermittent activity on the neural  monitoring. Minimal retraction was utilized throughout.   The wound was copiously irrigated throughout the case. Inspection revealed  no evidence of CSF leakage or active bleeding and there was good respiration  of the thecal sac centrally. Felt the decompression had been excellent. It  was fairly impressive of the amount of stenosis that was noted particularly  of 3-4 nondistended 4-5. Next, we  placed thrombin soaked Gelfoam in the  laminotomy defect, placed a Hemovac and brought it out through a lateral  stab wound in the skin. Removed the McCullough retractors, utilized  electrocautery to achieve hemostasis in the paraspinous musculature. We  reapproximated the muscles with #0 Vicryl sutures and the dorsal lumbar  fascia with #1 Vicryl interrupted figure-of-eight sutures, subcutaneous  tissue reapproximated with 2-0 Vicryl simple sutures. The skin was  reapproximated with staples. The wound was dressed sterilely. The patient  placed supine in the hospital bed, extubated without difficulty and  transported to the recovery room in satisfactory condition.   The patient tolerated the procedure well with no complications. Estimated  blood loss was approximately 750 cc.                                               Jene Every, M.D.    Cordelia Pen  D:  09/17/2002  T:  09/17/2002  Job:  [id number]

## 2011-04-03 ENCOUNTER — Ambulatory Visit (HOSPITAL_COMMUNITY): Payer: Medicare Other

## 2011-04-03 ENCOUNTER — Ambulatory Visit (HOSPITAL_COMMUNITY)
Admission: RE | Admit: 2011-04-03 | Discharge: 2011-04-03 | Disposition: A | Discharge status: Home / Self Care | Payer: Medicare Other | Source: Ambulatory Visit | Attending: Urology | Admitting: Urology

## 2011-04-03 DIAGNOSIS — R9431 Abnormal electrocardiogram [ECG] [EKG]: Secondary | ICD-10-CM | POA: Insufficient documentation

## 2011-04-03 DIAGNOSIS — E669 Obesity, unspecified: Secondary | ICD-10-CM | POA: Insufficient documentation

## 2011-04-03 DIAGNOSIS — E119 Type 2 diabetes mellitus without complications: Secondary | ICD-10-CM | POA: Insufficient documentation

## 2011-04-03 DIAGNOSIS — Z79899 Other long term (current) drug therapy: Secondary | ICD-10-CM | POA: Insufficient documentation

## 2011-04-03 DIAGNOSIS — Z01812 Encounter for preprocedural laboratory examination: Secondary | ICD-10-CM | POA: Insufficient documentation

## 2011-04-03 DIAGNOSIS — I10 Essential (primary) hypertension: Secondary | ICD-10-CM | POA: Insufficient documentation

## 2011-04-03 DIAGNOSIS — N200 Calculus of kidney: Secondary | ICD-10-CM | POA: Insufficient documentation

## 2011-04-03 DIAGNOSIS — R319 Hematuria, unspecified: Secondary | ICD-10-CM | POA: Insufficient documentation

## 2011-04-03 DIAGNOSIS — IMO0002 Reserved for concepts with insufficient information to code with codable children: Secondary | ICD-10-CM | POA: Insufficient documentation

## 2011-04-03 LAB — BASIC METABOLIC PANEL
BUN: 14 mg/dL (ref 6–23)
Chloride: 100 mEq/L (ref 96–112)
Creatinine, Ser: 0.86 mg/dL (ref 0.50–1.35)
GFR calc non Af Amer: >60 mL/min (ref >60)
Glucose, Bld: 124 mg/dL — ABNORMAL HIGH (ref 70–99)
Potassium: 4.4 mEq/L (ref 3.5–5.1)
Sodium: 136 mEq/L (ref 135–145)

## 2011-04-03 LAB — GLUCOSE, CAPILLARY: Glucose-Capillary: 125 mg/dL — ABNORMAL HIGH (ref 70–99)

## 2011-04-03 LAB — CBC
Hemoglobin: 13.2 g/dL (ref 13.0–17.0)
MCHC: 33.1 g/dL (ref 30.0–36.0)

## 2011-07-17 LAB — COMPREHENSIVE METABOLIC PANEL
AST: 34
BUN: 23
Creatinine, Ser: 0.99
GFR calc non Af Amer: >60
Potassium: 4.4
Sodium: 143
Total Bilirubin: 0.5
Total Protein: 6.2

## 2011-07-17 LAB — URINALYSIS, ROUTINE W REFLEX MICROSCOPIC
Glucose, UA: NEGATIVE
Hgb urine dipstick: NEGATIVE
Protein, ur: NEGATIVE
pH: 5.5

## 2011-07-17 LAB — CBC
HCT: 39
Hemoglobin: 12.1 — ABNORMAL LOW
Hemoglobin: 13.3
MCHC: 34.1
MCV: 94.5
MCV: 94.9
Platelets: 185
Platelets: 221
RBC: 3.73 — ABNORMAL LOW
RBC: 3.91 — ABNORMAL LOW
RDW: 13
WBC: 11.4 — ABNORMAL HIGH
WBC: 13.2 — ABNORMAL HIGH

## 2011-07-17 LAB — BASIC METABOLIC PANEL
BUN: 13
CO2: 28
Calcium: 9.4
Chloride: 102
Chloride: 103
Creatinine, Ser: 0.9
GFR calc Af Amer: >60
GFR calc Af Amer: >60
GFR calc non Af Amer: >60
Potassium: 4.1
Sodium: 136

## 2011-07-17 LAB — GLUCOSE, CAPILLARY
Glucose-Capillary: 161 — ABNORMAL HIGH
Glucose-Capillary: 179 — ABNORMAL HIGH

## 2011-07-17 LAB — APTT: aPTT: 28

## 2012-07-16 HISTORY — PX: TRANSTHORACIC ECHOCARDIOGRAM: SHX275

## 2012-08-08 HISTORY — PX: OTHER SURGICAL HISTORY: SHX169

## 2012-08-22 HISTORY — PX: OTHER SURGICAL HISTORY: SHX169

## 2012-09-24 DIAGNOSIS — G4733 Obstructive sleep apnea (adult) (pediatric): Secondary | ICD-10-CM | POA: Insufficient documentation

## 2012-09-24 HISTORY — DX: Obstructive sleep apnea (adult) (pediatric): G47.33

## 2013-05-16 ENCOUNTER — Encounter: Payer: Self-pay | Admitting: Cardiology

## 2013-05-16 ENCOUNTER — Ambulatory Visit (INDEPENDENT_AMBULATORY_CARE_PROVIDER_SITE_OTHER): Payer: Medicare Other | Admitting: Cardiology

## 2013-05-16 VITALS — BP 136/64 | HR 65 | Ht 71.5 in | Wt 253.6 lb

## 2013-05-16 DIAGNOSIS — I10 Essential (primary) hypertension: Secondary | ICD-10-CM

## 2013-05-16 DIAGNOSIS — E781 Pure hyperglyceridemia: Secondary | ICD-10-CM

## 2013-05-16 DIAGNOSIS — Z87898 Personal history of other specified conditions: Secondary | ICD-10-CM

## 2013-05-16 DIAGNOSIS — E119 Type 2 diabetes mellitus without complications: Secondary | ICD-10-CM

## 2013-05-16 DIAGNOSIS — G4733 Obstructive sleep apnea (adult) (pediatric): Secondary | ICD-10-CM

## 2013-05-16 DIAGNOSIS — R7989 Other specified abnormal findings of blood chemistry: Secondary | ICD-10-CM

## 2013-05-16 DIAGNOSIS — E669 Obesity, unspecified: Secondary | ICD-10-CM

## 2013-05-16 DIAGNOSIS — E291 Testicular hypofunction: Secondary | ICD-10-CM

## 2013-05-16 NOTE — Patient Instructions (Addendum)
You are doing great! Congratulations on the weight loss.   Glad to see you are off the one BP medicine -- less dizzy & less swelling.  As long as you are doing as well as you are, I think you are stable for moving out to 1 year follow-up appointments.  Marykay Lex, MD

## 2013-06-04 ENCOUNTER — Encounter: Payer: Self-pay | Admitting: Cardiology

## 2013-06-04 DIAGNOSIS — R7989 Other specified abnormal findings of blood chemistry: Secondary | ICD-10-CM | POA: Insufficient documentation

## 2013-06-04 DIAGNOSIS — E669 Obesity, unspecified: Secondary | ICD-10-CM | POA: Insufficient documentation

## 2013-06-04 DIAGNOSIS — Z87898 Personal history of other specified conditions: Secondary | ICD-10-CM | POA: Insufficient documentation

## 2013-06-04 DIAGNOSIS — E781 Pure hyperglyceridemia: Secondary | ICD-10-CM | POA: Insufficient documentation

## 2013-06-04 DIAGNOSIS — I10 Essential (primary) hypertension: Secondary | ICD-10-CM | POA: Insufficient documentation

## 2013-06-04 DIAGNOSIS — E1169 Type 2 diabetes mellitus with other specified complication: Secondary | ICD-10-CM | POA: Insufficient documentation

## 2013-06-04 NOTE — Assessment & Plan Note (Signed)
Most of his dizziness is probably due to orthostatic hypotension. He is on left blood pressure medication now they've ever been bloody amlodipine being stopped the combination of amlodipine and probably Cardura would have made him more susceptible to orthostatic changes.  His blood pressure seems to be quite stable now. I would simply continue to avoid using amlodipine, as his swelling is also improved since it was stopped.

## 2013-06-04 NOTE — Assessment & Plan Note (Signed)
Blood pressure is well-controlled today. He is on ACE inhibitor as a diabetic but otherwise not on any other blood pressure medication.

## 2013-06-04 NOTE — Assessment & Plan Note (Signed)
We talked for several minutes about my concerns with testosterone supplementation. Indeed the recent data has spoken against the use of hormonal supplementation in men of his age, however this concern is also mitigated by the fact that he has extreme fatigue without supplementation. My recommendation would be simply supplementation back up to a slightly below normal or just low normal level. He does understand that it does carry with it some cardiac vascular risk.

## 2013-06-04 NOTE — Assessment & Plan Note (Signed)
He is a quarter marketable improvement in weight with a 13 pound weight loss in 8 months this is borderline goal for him, with a desired weight loss in the year of just over 26 pounds last weight. His hope is to be down a 13+ pounds by the end of the year. I congratulated him on the effort, and encouraged to continue to do so. He Seems to Be Quite Happy with the "Skinny Fiber ", which is supple and not familiar with. With this he is also taken on some dietary modification as well. 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

## 2013-06-04 NOTE — Assessment & Plan Note (Signed)
This is perhaps one of the greater accomplished this for him, now being on CPAP and much improved sleep habits. This really has helped his energy level.

## 2013-06-04 NOTE — Progress Notes (Signed)
Patient ID: Ethan Goodman, male   DOB: 08/07/1946, 67 y.o.   MRN: [id number] PCP: No primary provider on file.  Clinic Note: Chief Complaint  Patient presents with  . 6 month    no chest pain,edema no change,no sob    HPI: Ethan Goodman is a 67 y.o. male with a PMH below who presents today for routine followup, delayed for 6 month. He is a very pleasant, obese gentleman with multiple cardiac risk factors who wanted to establish himself with a cardiologist, and is clearly has brought his wife over to be my patient as well. He was a for patient Dr. Bonnee Quin, who was looking for a new cardiologist when he retired. 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 As far as I can tell he has never had any true cardiac issues.  He has had a cardiac catheterization in the past that was negative for any obstructive coronary disease.  He's here today he, as his wife felt like he needed to come be evaluated. The complaint listed is that of dizziness, however he noted a few episodes where he felt a little bit dizzy when he had been eating and drinking very well and his sugars got out of control. He recently stopped his amlodipine, and now notes significantly improved dizziness. A lot of dizziness was noted to be positional  Interval History: 1 I talked to him, he denies having any significant discomfort in his chest with rest or exertion. He says he has some musculoskeletal discomfort on both sides of his upper chest and shoulders after doing a lot of lifting, but denies any exertional chest heaviness or pressure he is low short of breath he does activities, but is noted to be improved as he has continued to lose weight. He is lost another 13 pounds since his last visit with diet modification and taking "Skinny Fiber "supplement. He says actually he thinks his weight is lower limit was measured today, but he has been put off his diet over the last few days.  Just this past week  he offloaded roughly 4000 pounds of foodstuffs for a soup kitchen, and denies any true symptoms associated with this. He denies any palpitations that are concerning to him, dysmenorrhea infrequent he has mild lotion edema that is stable for him. He denies any PND or orthopnea, andsleeps very well tonight with his CPAP now. This is a lot and have wondered in the course of the day. He denies any melena, hematochezia or hematuria. He denies any TIA or amaurosis fugax symptoms.  Past Medical History  Diagnosis Date  . Diabetes mellitus type 2 in obese   . Hypertension   . Obesity (BMI 30-39.9)   . OSA on CPAP 09/24/2012    Followed by Dr. Tresa Endo, last visit February 2014 -- marked improvement in daytime sleepiness. Able to sleep through the night  . Hypertriglyceridemia without hypercholesterolemia     Last noted cholesterol the total showing 70, HDL 50, LDL unable to be calculated due to triglycerides of 10/18/1997  . Erectile dysfunction   . Low testosterone     On replacement supplement    Prior Cardiac Evaluation and Past Surgical History: Past Surgical History  Procedure Laterality Date  . Cardiac catheterization  2003    Nonobstructive CAD  . Transthoracic echocardiogram      Moderate concentric LVH, normal EF, grade 1 diastolic dysfunction and mild aortic sclerosis.  Allergies  Allergen Reactions  . Lorazepam     Current Outpatient Prescriptions  Medication Sig Dispense Refill  . benazepril (LOTENSIN) 20 MG tablet Take 20 mg by mouth 2 (two) times daily.      Marland Kitchen glipiZIDE (GLUCOTROL) 5 MG tablet Take 5 mg by mouth 2 (two) times daily before a meal.      . metFORMIN (GLUCOPHAGE) 500 MG tablet Take 500 mg by mouth 2 (two) times daily with a meal.      . Multiple Vitamins-Minerals (MULTIVITAMIN WITH MINERALS) tablet Take 1 tablet by mouth daily.      . NON FORMULARY C-PAP at bedtime      . OVER THE COUNTER MEDICATION Skinny fiber dietary supplment      . potassium citrate  (UROCIT-K) 10 MEQ (1080 MG) SR tablet Take 10 mEq by mouth 2 (two) times daily.      Marland Kitchen zinc gluconate 50 MG tablet Take 50 mg by mouth. 2tablets      . doxazosin (CARDURA) 4 MG tablet       . finasteride (PROSCAR) 5 MG tablet       . testosterone cypionate (DEPOTESTOTERONE CYPIONATE) 100 MG/ML injection        No current facility-administered medications for this visit.   History   Social History Narrative   He is a married father of one, with 2 stepchildren. His wife is also patient of mine with coronary disease. He has one grandchild and 5 stepgrandchildren as well as 6 step great-grandchildren. 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 Other than this activity, he does not get routine exercise. He's never smoked and does not drink alcohol.   ROS: A comprehensive Review of Systems - Negative except Mild, expected musculoskeletal/arthritic pains in his hips and knees; he also has slightly diminished stamina, for which he is still taking his testosterone replacement. Same and he is at a stable range.  PHYSICAL EXAM BP 136/64  Pulse 65  Ht 5' 11.5" (1.816 m)  Wt 253 lb 9.6 oz (115.032 kg)  BMI 34.88 kg/m2  General: he is an obese gentleman, not moderately obese with a BMI of 35 -- mostly truncal obesity. He is well-groomed and well-presented. He is in no acute distress, with a positive / up-beat mood & affect --just concern aover his wife's health conditions. He is alert and oriented x3 and answers questions appropriately.  HEENT: NCAT, EOMI. Anicteric sclerae. MMM.  Neck: Supple, no LAN, JVD or carotid bruit.  Heart: RRR, normal S1, S2 with a soft S4 gallop; no heaves or thrills or murmurs or rubs. Nondisplaced PMI, just really unable to palpate due to body habitus.  Lungs: CTAB, nonlabored, normal effort, good air movement.  Abdomen: Protuberant, truncal obesity but no ascites  and no pitting edema. No HSM.  Extremities: Trace edema in the ankles to mid shin with bounding pulses bilaterally. No significant venous stasis changes  NWG:NFAOZHYQM today: Yes Rate:65 , Rhythm: NSR, normal ECG.    Recent Labs: None available  ASSESSMENT / PLAN:  H/O dizziness  Most of his dizziness is probably due to orthostatic hypotension. He is on left blood pressure medication now they've ever been bloody amlodipine being stopped the combination of amlodipine and probably Cardura would have made him more susceptible to orthostatic changes.  His blood pressure seems to be quite stable now. I would simply continue to  avoid using amlodipine, as his swelling is also improved since it was stopped.  Obesity (BMI 30-39.9) He is a quarter marketable improvement in weight with a 13 pound weight loss in 8 months this is borderline goal for him, with a desired weight loss in the year of just over 26 pounds last weight. His hope is to be down a 13+ pounds by the end of the year. I congratulated him on the effort, and encouraged to continue to do so. He Seems to Be Quite Happy with the "Skinny Fiber ", which is supple and not familiar with. With this he is also taken on some dietary modification as well. 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  Hypertension Blood pressure is well-controlled today. He is on ACE inhibitor as a diabetic but otherwise not on any other blood pressure medication.  Hypertriglyceridemia without hypercholesterolemia This is followed by his primary physician, have not seen labs on him recently. My hope is that his values will improve dramatically as he continues to lose weight.  OSA on CPAP This is perhaps one of the greater accomplished this for him, now being on CPAP and much improved sleep habits. This really has helped  his energy level.  Low testosterone We talked for several minutes about my concerns with testosterone supplementation. Indeed the recent data has spoken against the use of hormonal supplementation in men of his age, however this concern is also mitigated by the fact that he has extreme fatigue without supplementation. My recommendation would be simply supplementation back up to a slightly below normal or just low normal level. He does understand that it does carry with it some cardiac vascular risk.   Total time of patient: 30 minutes; 15 minutes with chart  Orders Placed This Encounter  Procedures  . EKG 12-Lead   Followup: One year  DAVID W. Herbie Baltimore, M.D., M.S. [facility] [address] [place], [place]  [address]  [phone] Pager # [phone]

## 2013-06-04 NOTE — Assessment & Plan Note (Signed)
This is followed by his primary physician, have not seen labs on him recently. My hope is that his values will improve dramatically as he continues to lose weight.

## 2013-08-12 ENCOUNTER — Encounter (HOSPITAL_COMMUNITY): Payer: Self-pay | Admitting: Emergency Medicine

## 2013-08-12 ENCOUNTER — Emergency Department (HOSPITAL_COMMUNITY): Payer: Medicare Other

## 2013-08-12 ENCOUNTER — Emergency Department (HOSPITAL_COMMUNITY)
Admission: EM | Admit: 2013-08-12 | Discharge: 2013-08-12 | Disposition: A | Discharge status: Home / Self Care | Payer: Medicare Other | Attending: Emergency Medicine | Admitting: Emergency Medicine

## 2013-08-12 DIAGNOSIS — E669 Obesity, unspecified: Secondary | ICD-10-CM | POA: Insufficient documentation

## 2013-08-12 DIAGNOSIS — S01311A Laceration without foreign body of right ear, initial encounter: Secondary | ICD-10-CM

## 2013-08-12 DIAGNOSIS — W108XXA Fall (on) (from) other stairs and steps, initial encounter: Secondary | ICD-10-CM | POA: Insufficient documentation

## 2013-08-12 DIAGNOSIS — Y9301 Activity, walking, marching and hiking: Secondary | ICD-10-CM | POA: Insufficient documentation

## 2013-08-12 DIAGNOSIS — Z87448 Personal history of other diseases of urinary system: Secondary | ICD-10-CM | POA: Insufficient documentation

## 2013-08-12 DIAGNOSIS — E119 Type 2 diabetes mellitus without complications: Secondary | ICD-10-CM | POA: Insufficient documentation

## 2013-08-12 DIAGNOSIS — S0990XA Unspecified injury of head, initial encounter: Secondary | ICD-10-CM | POA: Insufficient documentation

## 2013-08-12 DIAGNOSIS — Z79899 Other long term (current) drug therapy: Secondary | ICD-10-CM | POA: Insufficient documentation

## 2013-08-12 DIAGNOSIS — I10 Essential (primary) hypertension: Secondary | ICD-10-CM | POA: Insufficient documentation

## 2013-08-12 DIAGNOSIS — S01309A Unspecified open wound of unspecified ear, initial encounter: Secondary | ICD-10-CM | POA: Insufficient documentation

## 2013-08-12 DIAGNOSIS — Y9289 Other specified places as the place of occurrence of the external cause: Secondary | ICD-10-CM | POA: Insufficient documentation

## 2013-08-12 DIAGNOSIS — I509 Heart failure, unspecified: Secondary | ICD-10-CM | POA: Insufficient documentation

## 2013-08-12 DIAGNOSIS — S7001XA Contusion of right hip, initial encounter: Secondary | ICD-10-CM

## 2013-08-12 DIAGNOSIS — G4733 Obstructive sleep apnea (adult) (pediatric): Secondary | ICD-10-CM | POA: Insufficient documentation

## 2013-08-12 DIAGNOSIS — Z9889 Other specified postprocedural states: Secondary | ICD-10-CM | POA: Insufficient documentation

## 2013-08-12 DIAGNOSIS — W010XXA Fall on same level from slipping, tripping and stumbling without subsequent striking against object, initial encounter: Secondary | ICD-10-CM | POA: Insufficient documentation

## 2013-08-12 DIAGNOSIS — S7000XA Contusion of unspecified hip, initial encounter: Secondary | ICD-10-CM | POA: Insufficient documentation

## 2013-08-12 DIAGNOSIS — E291 Testicular hypofunction: Secondary | ICD-10-CM | POA: Insufficient documentation

## 2013-08-12 HISTORY — DX: Heart failure, unspecified: I50.9

## 2013-08-12 MED ORDER — HYDROCODONE-ACETAMINOPHEN 5-325 MG PO TABS: 2.0000 | ORAL_TABLET | ORAL | Status: DC | PRN

## 2013-08-12 NOTE — ED Notes (Signed)
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 Pt c/o right hip pain, knot on back of head and laceration to right ear. Pt denies taking any blood thinners.

## 2013-08-12 NOTE — Progress Notes (Signed)
EDCM spoke to patient at bedside.  Patient is extremely hard of hearing.  Patient reports her pcp is Dr. Edger House.  EDCM unable to locate this physician. No paperwork from nursing facility seen at this time.

## 2013-08-12 NOTE — ED Provider Notes (Signed)
CSN: [id number]     Arrival date & time 08/12/13  1449 History   First MD Initiated Contact with Patient 08/12/13 1536     Chief Complaint  Patient presents with  . Fall  . Head Injury  . Ear Laceration  . Hip Pain    right    HPI  Patient was walking up some steps when he slipped and fell. End of his right upper thigh or/lateral hip. Also struck his head has a laceration to the pinna of his right ear. No loss of consciousness. No nausea vomiting. Minimal headache. At the hip is sore but he is ambulatory. No injury to his ribs her chest. No injury to the face. No lotion injury other than as described above.  Past Medical History  Diagnosis Date  . Diabetes mellitus type 2 in obese   . Hypertension   . Obesity (BMI 30-39.9)   . OSA on CPAP 09/24/2012    Followed by Dr. Tresa Endo, last visit February 2014 -- marked improvement in daytime sleepiness. Able to sleep through the night  . Hypertriglyceridemia without hypercholesterolemia     Last noted cholesterol the total showing 70, HDL 50, LDL unable to be calculated due to triglycerides of 10/18/1997  . Erectile dysfunction   . Low testosterone     On replacement supplement  . CHF (congestive heart failure)    Past Surgical History  Procedure Laterality Date  . Cardiac catheterization  2003    Nonobstructive CAD  . Transthoracic echocardiogram      Moderate concentric LVH, normal EF, grade 1 diastolic dysfunction and mild aortic sclerosis.   No family history on file. History  Substance Use Topics  . Smoking status: Never Smoker   . Smokeless tobacco: Not on file  . Alcohol Use: No    Review of Systems  Constitutional: Negative for fever, chills, diaphoresis, appetite change and fatigue.  HENT: Positive for ear pain. Negative for mouth sores, sore throat and trouble swallowing.        Ear laceration  Eyes: Negative for visual disturbance.  Respiratory: Negative for cough, chest tightness, shortness of breath and wheezing.    Cardiovascular: Negative for chest pain.  Gastrointestinal: Negative for nausea, vomiting, abdominal pain, diarrhea and abdominal distention.  Endocrine: Negative for polydipsia, polyphagia and polyuria.  Genitourinary: Negative for dysuria, frequency and hematuria.  Musculoskeletal: Negative for gait problem.       Bruising lateral to the hip anterior to the buttock  Skin: Positive for wound. Negative for color change, pallor and rash.  Neurological: Negative for dizziness, syncope, light-headedness and headaches.  Hematological: Does not bruise/bleed easily.  Psychiatric/Behavioral: Negative for behavioral problems and confusion.    Allergies  Lorazepam and Morphine and related  Home Medications   Current Outpatient Rx  Name  Route  Sig  Dispense  Refill  . benazepril (LOTENSIN) 20 MG tablet   Oral   Take 20 mg by mouth daily.          Marland Kitchen doxazosin (CARDURA) 4 MG tablet   Oral   Take 4 mg by mouth at bedtime.          . finasteride (PROSCAR) 5 MG tablet               . glipiZIDE (GLUCOTROL) 5 MG tablet   Oral   Take 5 mg by mouth 2 (two) times daily before a meal.         . metFORMIN (GLUCOPHAGE) 500 MG tablet  Oral   Take 500 mg by mouth 2 (two) times daily with a meal.         . Multiple Vitamins-Minerals (MULTIVITAMIN WITH MINERALS) tablet   Oral   Take 1 tablet by mouth daily.         . NON FORMULARY      C-PAP at bedtime         . OVER THE COUNTER MEDICATION      Skinny fiber dietary supplment         . potassium citrate (UROCIT-K) 10 MEQ (1080 MG) SR tablet   Oral   Take 10 mEq by mouth 2 (two) times daily.         Marland Kitchen testosterone cypionate (DEPOTESTOTERONE CYPIONATE) 100 MG/ML injection   Intramuscular   Inject 200 mg into the muscle every 14 (fourteen) days.          Marland Kitchen zinc gluconate 50 MG tablet   Oral   Take 50 mg by mouth. 2tablets         . HYDROcodone-acetaminophen (NORCO/VICODIN) 5-325 MG per tablet   Oral   Take  2 tablets by mouth every 4 (four) hours as needed for pain.   10 tablet   0    BP 139/70  Pulse 65  Temp(Src) 98.2 F (36.8 C) (Oral)  Resp 16  SpO2 98% Physical Exam  Constitutional: He is oriented to person, place, and time. He appears well-developed and well-nourished. No distress.  HENT:  Head: Normocephalic.  Ears:  Eyes: Conjunctivae are normal. Pupils are equal, round, and reactive to light. No scleral icterus.  Neck: Normal range of motion. Neck supple. No thyromegaly present.  Cardiovascular: Normal rate and regular rhythm.  Exam reveals no gallop and no friction rub.   No murmur heard. Pulmonary/Chest: Effort normal and breath sounds normal. No respiratory distress. He has no wheezes. He has no rales.  Abdominal: Soft. Bowel sounds are normal. He exhibits no distension. There is no tenderness. There is no rebound.  Musculoskeletal: Normal range of motion.       Legs: Ecymosis over greater trochanter, and onto buttock.  Neurological: He is alert and oriented to person, place, and time.  Skin: Skin is warm and dry. No rash noted.  Psychiatric: He has a normal mood and affect. His behavior is normal.    ED Course  LACERATION REPAIR Date/Time: 08/12/2013 4:46 PM Performed by: Roney Marion Authorized by: Rolland Porter J Consent: Verbal consent obtained. written consent not obtained. Risks and benefits: risks, benefits and alternatives were discussed Consent given by: patient Patient understanding: patient states understanding of the procedure being performed Patient identity confirmed: verbally with patient Body area: head/neck Location details: right ear Laceration length: 2 cm Vascular damage: no Anesthesia: local infiltration Local anesthetic: lidocaine 1% without epinephrine Anesthetic total: 3 ml Irrigation solution: saline Amount of cleaning: standard Debridement: none Degree of undermining: none Skin closure: 4-0 nylon Number of sutures: 7 Technique:  simple Approximation: close Approximation difficulty: simple Comments: Pinna filled  with iodoform gauze.  2x2 and 4x4 placed under ear/pinna.  4x4 over pinna, then 4"ace around head with moderate pressure.  No bleeding noted   (including critical care time) Labs Review Labs Reviewed - No data to display Imaging Review Dg Pelvis 1-2 Views  08/12/2013   CLINICAL DATA:  Fall.  EXAM: PELVIS - 1-2 VIEW  COMPARISON:  None.  FINDINGS: There is no evidence of pelvic fracture or diastasis. No other pelvic bone lesions are seen.  Postsurgical changes from lumbar spine fusion identified.  IMPRESSION: 1. No acute findings identified.   Electronically Signed   By: Signa Kell M.D.   On: 08/12/2013 17:24   Ct Head Wo Contrast  08/12/2013   CLINICAL DATA:  Vertigo post trauma  EXAM: CT HEAD WITHOUT CONTRAST  TECHNIQUE: Contiguous axial images were obtained from the base of the skull through the vertex without intravenous contrast. Study was obtained within 24 hr of patient's arrival at the emergency department.  COMPARISON:  None.  FINDINGS: There is mild diffuse atrophy. There is no mass, hemorrhage, extra-axial fluid collection, or midline shift. There is rather minimal small vessel disease in the centra semiovale bilaterally. Gray-white compartments are otherwise normal. There is no demonstrable acute infarct.  The bony calvarium appears intact. The mastoid air cells are clear. There is evidence of debris in the left external auditory canal, likely cerumen.  There is opacification of posterior ethmoid air cells on the left. There is incomplete visualization of a retention cyst in the right maxillary antrum.  IMPRESSION: Atrophy with minimal small vessel disease. No intracranial mass, hemorrhage, or acute appearing infarct. Paranasal sinus disease is present. Probable cerumen present in the left external auditory canal.   Electronically Signed   By: Bretta Bang M.D.   On: 08/12/2013 17:22    EKG  Interpretation   None       MDM   1. Contusion, hip, right, initial encounter   2. Laceration of ear, right, initial encounter    Pt to leave dressing/ace on until tomorrow.  Re check with any hematoma noted.  Signs of hematoma discussed with wife .  Pt and wife understand to recheck with any auricular hematoma tomorrow.  Rx:  For flonase nasal MDI for Ethmoid Sinusitis (Asymptomatic).   Roney Marion, MD 08/12/13 1911

## 2013-11-13 ENCOUNTER — Telehealth: Payer: Self-pay | Admitting: *Deleted

## 2013-11-13 NOTE — Telephone Encounter (Signed)
Faxed CPAP supply order.

## 2013-11-21 ENCOUNTER — Encounter: Payer: Self-pay | Admitting: Cardiovascular Disease

## 2014-07-16 ENCOUNTER — Ambulatory Visit: Payer: No Typology Code available for payment source | Admitting: Cardiology

## 2014-08-17 ENCOUNTER — Encounter: Payer: Self-pay | Admitting: Cardiology

## 2014-08-17 ENCOUNTER — Ambulatory Visit (INDEPENDENT_AMBULATORY_CARE_PROVIDER_SITE_OTHER): Payer: Medicare Other | Admitting: Cardiology

## 2014-08-17 VITALS — BP 150/80 | HR 58 | Ht 71.0 in | Wt 250.0 lb

## 2014-08-17 DIAGNOSIS — G4733 Obstructive sleep apnea (adult) (pediatric): Secondary | ICD-10-CM

## 2014-08-17 DIAGNOSIS — R7989 Other specified abnormal findings of blood chemistry: Secondary | ICD-10-CM

## 2014-08-17 DIAGNOSIS — Z9989 Dependence on other enabling machines and devices: Secondary | ICD-10-CM

## 2014-08-17 DIAGNOSIS — E119 Type 2 diabetes mellitus without complications: Secondary | ICD-10-CM

## 2014-08-17 DIAGNOSIS — E291 Testicular hypofunction: Secondary | ICD-10-CM

## 2014-08-17 DIAGNOSIS — E669 Obesity, unspecified: Secondary | ICD-10-CM

## 2014-08-17 DIAGNOSIS — E1169 Type 2 diabetes mellitus with other specified complication: Secondary | ICD-10-CM

## 2014-08-17 DIAGNOSIS — I10 Essential (primary) hypertension: Secondary | ICD-10-CM

## 2014-08-17 DIAGNOSIS — Z87898 Personal history of other specified conditions: Secondary | ICD-10-CM

## 2014-08-17 DIAGNOSIS — E781 Pure hyperglyceridemia: Secondary | ICD-10-CM

## 2014-08-17 MED ORDER — BENAZEPRIL HCL 20 MG PO TABS: 20.0000 mg | ORAL_TABLET | Freq: Every day | ORAL | Status: DC

## 2014-08-17 NOTE — Patient Instructions (Addendum)
NO CHANGE IN MEDICATION  REFILL BENAZEPRIL   Your physician wants you to follow-up in 12 months Dr Ellyn Hack. - 30 mins appt.  You will receive a reminder letter in the mail two months in advance. If you don't receive a letter, please call our office to schedule the follow-up appointment.

## 2014-08-18 NOTE — Assessment & Plan Note (Signed)
Still only on oral medications.

## 2014-08-18 NOTE — Assessment & Plan Note (Signed)
Followed by Dr. Claiborne Billings. Had not seen him in some time, but is not having issues with CPAP.

## 2014-08-18 NOTE — Progress Notes (Signed)
PCP: Gilford Rile, MD  Clinic Note: Chief Complaint  Patient presents with  . Follow-up    pt denies chest pain and sob. pt states that he does have swelling    HPI: Ethan Goodman is a 68 y.o. male with a PMH below who presents today for one-year follow-up.  Past Medical History  Diagnosis Date  . Diabetes mellitus type 2 in obese   . Hypertension   . Obesity (BMI 30-39.9)   . OSA on CPAP 09/24/2012    Followed by Dr. Claiborne Billings, last visit February 2014 -- marked improvement in daytime sleepiness. Able to sleep through the night  . Hypertriglyceridemia without hypercholesterolemia     Last noted cholesterol the total showing 70, HDL 50, LDL unable to be calculated due to triglycerides of 10/18/1997  . Erectile dysfunction   . Low testosterone     On replacement supplement  . CHF (congestive heart failure)   . High cholesterol   . Stomach problems   . Sleep apnea     AHI of 78.1  . Diverticulosis   . Degenerative disc disease, lumbar     Prior Cardiac Evaluation and Past Surgical History: Past Surgical History  Procedure Laterality Date  . Cardiac catheterization  2003    Nonobstructive CAD  . Transthoracic echocardiogram      Moderate concentric LVH, normal EF, grade 1 diastolic dysfunction and mild aortic sclerosis.  . Polysomnogram  08/08/12    Diagnostic  . Cpap titration  08/22/2012   Interval History: Severn presents today for annual follow-upwith no major complaints besides "feeling totally stressed out. He is now working out of the gym about 3 days a week. Is also taking the CPAP and having much better sleep. His blood pressures have been pretty well-controlled home but her high today continues quite stressed out about having to perform a funeral tomorrow.he has mild lower extremity edema is at least partially pitting. No PNDor orthopnea over. We'll exertional dyspnea. 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 He does have notable palpitations, but no symptoms of lightheadedness, dizziness, weakness or syncope/near syncope. No TIA/amaurosis fugax symptoms.  ROS: A comprehensive was performed. Review of Systems  Constitutional: Positive for malaise/fatigue. Negative for weight loss.  HENT: Negative for congestion and nosebleeds.   Respiratory: Negative for cough, hemoptysis, shortness of breath and wheezing.   Cardiovascular: Positive for leg swelling. Negative for chest pain and claudication.  Gastrointestinal: Negative for blood in stool.  Genitourinary: Negative for hematuria.  Musculoskeletal:       Intermittent bilateral leg discomfort.  Skin: Positive for rash.  Neurological: Positive for weakness. Negative for dizziness, sensory change, focal weakness, seizures and loss of consciousness.  Psychiatric/Behavioral: Positive for depression and memory loss. Negative for hallucinations. The patient is not nervous/anxious.   All other systems reviewed and are negative.   Current Outpatient Prescriptions on File Prior to Visit  Medication Sig Dispense Refill  . doxazosin (CARDURA) 4 MG tablet Take 4 mg by mouth at bedtime.     . finasteride (PROSCAR) 5 MG tablet     . glipiZIDE (GLUCOTROL) 5 MG tablet Take 5 mg by mouth 2 (two) times daily before a meal.    . HYDROcodone-acetaminophen (NORCO/VICODIN) 5-325 MG per tablet Take 2 tablets by mouth every 4 (four) hours as needed for pain. 10 tablet 0  . metFORMIN (GLUCOPHAGE) 500 MG tablet Take  500 mg by mouth 2 (two) times daily with a meal.    . Multiple Vitamins-Minerals (MULTIVITAMIN WITH MINERALS) tablet Take 1 tablet by mouth daily.    . NON FORMULARY C-PAP at bedtime    . OVER THE COUNTER MEDICATION Skinny fiber dietary supplment    . potassium citrate (UROCIT-K) 10 MEQ (1080 MG) SR tablet Take 10 mEq by mouth 2 (two) times daily.    Marland Kitchen testosterone cypionate (DEPOTESTOTERONE  CYPIONATE) 100 MG/ML injection Inject 200 mg into the muscle every 14 (fourteen) days.     Marland Kitchen zinc gluconate 50 MG tablet Take 50 mg by mouth. 2tablets     No current facility-administered medications on file prior to visit.   ALLERGIES REVIEWED IN EPIC -- no change SOCIAL AND FAMILY HISTORY REVIEWED IN EPIC -- no change  Wt Readings from Last 3 Encounters:  08/17/14 250 lb (113.399 kg)  05/16/13 253 lb 9.6 oz (115.032 kg)   PHYSICAL EXAM BP 150/80 mmHg  Pulse 58  Ht 5\' 11"  (1.803 m)  Wt 250 lb (113.399 kg)  BMI 34.88 kg/m2 General: he is an obese gentleman, not moderately obese with a BMI of 35 -- mostly truncal obesity. He is well-groomed and well-presented. He is in no acute distress, with a positive / up-beat mood & affect --just concern aover his wife's health conditions. He is alert and oriented x3 and answers questions appropriately.  HEENT: NCAT, EOMI. Anicteric sclerae. MMM.  Neck: Supple, no LAN, JVD or carotid bruit.  Heart: RRR, normal S1, S2 with a soft S4 gallop; no heaves or thrills or murmurs or rubs. Nondisplaced PMI, just really unable to palpate due to body habitus.  Lungs: CTAB, nonlabored, normal effort, good air movement.  Abdomen: Protuberant, truncal obesity but no ascites and no pitting edema. No HSM.  Extremities: Trace edema in the ankles to mid shin with bounding pulses bilaterally. No significant venous stasis changes   Adult ECG Report  Rate: 58 ;  Rhythm: sinus bradycardia and left axis deviation (-34)  Narrative Interpretation: otherwise normal EKG with normal voltage, durations and segments.  Recent Labs:  None available   ASSESSMENT / PLAN: Essential hypertension Borderline control today, but he has not had his medicines yet and is quite stressed out. Will need to monitor it off and on the scene however levels are maintaining.  Hypertriglyceridemia without hypercholesterolemia Monitored by PCP.triglycerides should improve with weight loss by  decreasing adiposity.  OSA on CPAP Followed by Dr. Claiborne Billings. Had not seen him in some time, but is not having issues with CPAP.  Diabetes mellitus type 2 in obese Still only on oral medications.  Low testosterone His fatigue certainly get better when he was replacing his testosterone levels, however now not been filling of the last month or so. I think more in moderation would probably the best thing. Would not shoot for low normal levels. Would be more happy with mild to moderately below normal  Obesity (BMI 30-39.9) Again I counseled the importance of weight loss, this would help his triglyceride levels as well as blood pressure. Dietary modification plus exercise counseled    Orders Placed This Encounter  Procedures  . EKG 12-Lead   Meds ordered this encounter  Medications  . glipiZIDE (GLUCOTROL XL) 5 MG 24 hr tablet    Sig:   . testosterone cypionate (DEPOTESTOTERONE CYPIONATE) 200 MG/ML injection    Sig:   . benazepril (LOTENSIN) 20 MG tablet    Sig: Take 1 tablet (20 mg total)  by mouth daily.    Dispense:  90 tablet    Refill:  3    Followup: one year   HARDING,DAVID W, M.D., M.S. Interventional Cardiologist   Pager # [phone]

## 2014-08-18 NOTE — Assessment & Plan Note (Signed)
His fatigue certainly get better when he was replacing his testosterone levels, however now not been filling of the last month or so. I think more in moderation would probably the best thing. Would not shoot for low normal levels. Would be more happy with mild to moderately below normal

## 2014-08-18 NOTE — Assessment & Plan Note (Signed)
Again I counseled the importance of weight loss, this would help his triglyceride levels as well as blood pressure. Dietary modification plus exercise counseled

## 2014-08-18 NOTE — Assessment & Plan Note (Signed)
Monitored by PCP.triglycerides should improve with weight loss by decreasing adiposity.

## 2014-08-18 NOTE — Assessment & Plan Note (Signed)
Borderline control today, but he has not had his medicines yet and is quite stressed out. Will need to monitor it off and on the scene however levels are maintaining.

## 2015-02-18 ENCOUNTER — Telehealth: Payer: Self-pay | Admitting: *Deleted

## 2015-02-18 NOTE — Telephone Encounter (Signed)
Faxed order confirmation  For CPAP machine and supplies to [facility].

## 2015-09-24 ENCOUNTER — Other Ambulatory Visit: Payer: Self-pay | Admitting: Cardiology

## 2015-12-30 DIAGNOSIS — M1711 Unilateral primary osteoarthritis, right knee: Secondary | ICD-10-CM | POA: Diagnosis not present

## 2016-01-13 DIAGNOSIS — I10 Essential (primary) hypertension: Secondary | ICD-10-CM | POA: Diagnosis not present

## 2016-01-13 DIAGNOSIS — Z6834 Body mass index (BMI) 34.0-34.9, adult: Secondary | ICD-10-CM | POA: Diagnosis not present

## 2016-01-13 DIAGNOSIS — F329 Major depressive disorder, single episode, unspecified: Secondary | ICD-10-CM | POA: Diagnosis not present

## 2016-01-13 DIAGNOSIS — Z79899 Other long term (current) drug therapy: Secondary | ICD-10-CM | POA: Diagnosis not present

## 2016-01-13 DIAGNOSIS — E119 Type 2 diabetes mellitus without complications: Secondary | ICD-10-CM | POA: Diagnosis not present

## 2016-01-13 DIAGNOSIS — E782 Mixed hyperlipidemia: Secondary | ICD-10-CM | POA: Diagnosis not present

## 2016-01-13 DIAGNOSIS — E669 Obesity, unspecified: Secondary | ICD-10-CM | POA: Diagnosis not present

## 2016-01-13 DIAGNOSIS — N400 Enlarged prostate without lower urinary tract symptoms: Secondary | ICD-10-CM | POA: Diagnosis not present

## 2016-01-22 DIAGNOSIS — H6123 Impacted cerumen, bilateral: Secondary | ICD-10-CM | POA: Diagnosis not present

## 2016-01-22 DIAGNOSIS — L600 Ingrowing nail: Secondary | ICD-10-CM | POA: Diagnosis not present

## 2016-02-08 DIAGNOSIS — D649 Anemia, unspecified: Secondary | ICD-10-CM | POA: Diagnosis not present

## 2016-02-10 ENCOUNTER — Ambulatory Visit: Payer: Medicare Other | Admitting: Sports Medicine

## 2016-02-14 DIAGNOSIS — D649 Anemia, unspecified: Secondary | ICD-10-CM | POA: Diagnosis not present

## 2016-02-14 DIAGNOSIS — Z79899 Other long term (current) drug therapy: Secondary | ICD-10-CM | POA: Diagnosis not present

## 2016-02-14 DIAGNOSIS — I10 Essential (primary) hypertension: Secondary | ICD-10-CM | POA: Diagnosis not present

## 2016-02-28 DIAGNOSIS — E538 Deficiency of other specified B group vitamins: Secondary | ICD-10-CM | POA: Diagnosis not present

## 2016-02-29 DIAGNOSIS — E538 Deficiency of other specified B group vitamins: Secondary | ICD-10-CM | POA: Diagnosis not present

## 2016-03-02 DIAGNOSIS — E538 Deficiency of other specified B group vitamins: Secondary | ICD-10-CM | POA: Diagnosis not present

## 2016-03-03 DIAGNOSIS — E538 Deficiency of other specified B group vitamins: Secondary | ICD-10-CM | POA: Diagnosis not present

## 2016-03-06 DIAGNOSIS — E538 Deficiency of other specified B group vitamins: Secondary | ICD-10-CM | POA: Diagnosis not present

## 2016-03-17 DIAGNOSIS — E538 Deficiency of other specified B group vitamins: Secondary | ICD-10-CM | POA: Diagnosis not present

## 2016-03-20 DIAGNOSIS — E538 Deficiency of other specified B group vitamins: Secondary | ICD-10-CM | POA: Diagnosis not present

## 2016-03-27 DIAGNOSIS — E538 Deficiency of other specified B group vitamins: Secondary | ICD-10-CM | POA: Diagnosis not present

## 2016-04-04 DIAGNOSIS — E538 Deficiency of other specified B group vitamins: Secondary | ICD-10-CM | POA: Diagnosis not present

## 2016-04-24 DIAGNOSIS — E538 Deficiency of other specified B group vitamins: Secondary | ICD-10-CM | POA: Diagnosis not present

## 2016-04-26 ENCOUNTER — Telehealth: Payer: Self-pay | Admitting: Cardiovascular Disease

## 2016-04-26 NOTE — Telephone Encounter (Signed)
Message routed to [place], [place] for orders for CPAP supplies  Of note, patient has not seen cardiology provider since 08/2014. Patient of Dr. Ellyn Hack

## 2016-04-26 NOTE — Telephone Encounter (Signed)
New message      Pt needs a CPAP filter.  Please fax sleep study and demographics to [facility] at [phone].  [facility] had been using [facility] but [facility] will not take  medicare.  [facility] is a new home health company.

## 2016-05-04 NOTE — Telephone Encounter (Signed)
Faxed supply referral to [facility].

## 2016-05-05 DIAGNOSIS — E538 Deficiency of other specified B group vitamins: Secondary | ICD-10-CM | POA: Diagnosis not present

## 2016-05-11 ENCOUNTER — Telehealth: Payer: Self-pay | Admitting: Cardiology

## 2016-05-11 DIAGNOSIS — E538 Deficiency of other specified B group vitamins: Secondary | ICD-10-CM | POA: Diagnosis not present

## 2016-05-11 NOTE — Telephone Encounter (Signed)
New Message  Pts wife wants know if husband can be transferred over to Dr. Evette Georges services

## 2016-05-12 NOTE — Telephone Encounter (Signed)
Certainly.   Dr. Linus Salmons is already taking care of OSA-CPAP. Makes sense.  Glenetta Hew, MD

## 2016-05-22 DIAGNOSIS — E538 Deficiency of other specified B group vitamins: Secondary | ICD-10-CM | POA: Diagnosis not present

## 2016-05-30 DIAGNOSIS — L821 Other seborrheic keratosis: Secondary | ICD-10-CM | POA: Diagnosis not present

## 2016-05-30 DIAGNOSIS — L570 Actinic keratosis: Secondary | ICD-10-CM | POA: Diagnosis not present

## 2016-05-30 DIAGNOSIS — L720 Epidermal cyst: Secondary | ICD-10-CM | POA: Diagnosis not present

## 2016-06-01 NOTE — Telephone Encounter (Signed)
ok 

## 2016-06-02 DIAGNOSIS — E538 Deficiency of other specified B group vitamins: Secondary | ICD-10-CM | POA: Diagnosis not present

## 2016-06-15 DIAGNOSIS — D649 Anemia, unspecified: Secondary | ICD-10-CM | POA: Diagnosis not present

## 2016-06-15 DIAGNOSIS — R5381 Other malaise: Secondary | ICD-10-CM | POA: Diagnosis not present

## 2016-06-15 DIAGNOSIS — N200 Calculus of kidney: Secondary | ICD-10-CM | POA: Diagnosis not present

## 2016-06-15 DIAGNOSIS — E119 Type 2 diabetes mellitus without complications: Secondary | ICD-10-CM | POA: Diagnosis not present

## 2016-06-15 DIAGNOSIS — E538 Deficiency of other specified B group vitamins: Secondary | ICD-10-CM | POA: Diagnosis not present

## 2016-06-15 DIAGNOSIS — R945 Abnormal results of liver function studies: Secondary | ICD-10-CM | POA: Diagnosis not present

## 2016-06-15 DIAGNOSIS — E291 Testicular hypofunction: Secondary | ICD-10-CM | POA: Diagnosis not present

## 2016-06-15 DIAGNOSIS — E782 Mixed hyperlipidemia: Secondary | ICD-10-CM | POA: Diagnosis not present

## 2016-06-15 DIAGNOSIS — F331 Major depressive disorder, recurrent, moderate: Secondary | ICD-10-CM | POA: Diagnosis not present

## 2016-06-15 DIAGNOSIS — I208 Other forms of angina pectoris: Secondary | ICD-10-CM | POA: Diagnosis not present

## 2016-06-15 DIAGNOSIS — I10 Essential (primary) hypertension: Secondary | ICD-10-CM | POA: Diagnosis not present

## 2016-06-15 DIAGNOSIS — R5383 Other fatigue: Secondary | ICD-10-CM | POA: Diagnosis not present

## 2016-07-17 ENCOUNTER — Ambulatory Visit: Payer: Medicare Other | Admitting: Cardiology

## 2016-07-29 DIAGNOSIS — M545 Low back pain: Secondary | ICD-10-CM | POA: Diagnosis not present

## 2016-07-29 DIAGNOSIS — E1141 Type 2 diabetes mellitus with diabetic mononeuropathy: Secondary | ICD-10-CM | POA: Diagnosis not present

## 2016-09-02 DIAGNOSIS — S233XXA Sprain of ligaments of thoracic spine, initial encounter: Secondary | ICD-10-CM | POA: Diagnosis not present

## 2016-09-04 DIAGNOSIS — N401 Enlarged prostate with lower urinary tract symptoms: Secondary | ICD-10-CM | POA: Diagnosis not present

## 2016-09-04 DIAGNOSIS — R351 Nocturia: Secondary | ICD-10-CM | POA: Diagnosis not present

## 2016-09-06 DIAGNOSIS — M542 Cervicalgia: Secondary | ICD-10-CM | POA: Diagnosis not present

## 2016-09-06 DIAGNOSIS — M546 Pain in thoracic spine: Secondary | ICD-10-CM | POA: Diagnosis not present

## 2016-10-04 DIAGNOSIS — T7840XA Allergy, unspecified, initial encounter: Secondary | ICD-10-CM | POA: Diagnosis not present

## 2016-10-18 DIAGNOSIS — K130 Diseases of lips: Secondary | ICD-10-CM | POA: Diagnosis not present

## 2017-01-19 DIAGNOSIS — M5416 Radiculopathy, lumbar region: Secondary | ICD-10-CM | POA: Diagnosis not present

## 2017-02-17 DIAGNOSIS — L570 Actinic keratosis: Secondary | ICD-10-CM | POA: Diagnosis not present

## 2017-02-17 DIAGNOSIS — B372 Candidiasis of skin and nail: Secondary | ICD-10-CM | POA: Diagnosis not present

## 2017-02-17 DIAGNOSIS — K130 Diseases of lips: Secondary | ICD-10-CM | POA: Diagnosis not present

## 2017-02-20 DIAGNOSIS — N400 Enlarged prostate without lower urinary tract symptoms: Secondary | ICD-10-CM | POA: Diagnosis not present

## 2017-02-20 DIAGNOSIS — I10 Essential (primary) hypertension: Secondary | ICD-10-CM | POA: Diagnosis not present

## 2017-02-20 DIAGNOSIS — Z79899 Other long term (current) drug therapy: Secondary | ICD-10-CM | POA: Diagnosis not present

## 2017-02-20 DIAGNOSIS — F331 Major depressive disorder, recurrent, moderate: Secondary | ICD-10-CM | POA: Diagnosis not present

## 2017-02-20 DIAGNOSIS — E291 Testicular hypofunction: Secondary | ICD-10-CM | POA: Diagnosis not present

## 2017-02-20 DIAGNOSIS — E782 Mixed hyperlipidemia: Secondary | ICD-10-CM | POA: Diagnosis not present

## 2017-02-20 DIAGNOSIS — N200 Calculus of kidney: Secondary | ICD-10-CM | POA: Diagnosis not present

## 2017-02-20 DIAGNOSIS — D649 Anemia, unspecified: Secondary | ICD-10-CM | POA: Diagnosis not present

## 2017-02-20 DIAGNOSIS — E538 Deficiency of other specified B group vitamins: Secondary | ICD-10-CM | POA: Diagnosis not present

## 2017-02-20 DIAGNOSIS — E119 Type 2 diabetes mellitus without complications: Secondary | ICD-10-CM | POA: Diagnosis not present

## 2017-02-20 DIAGNOSIS — R945 Abnormal results of liver function studies: Secondary | ICD-10-CM | POA: Diagnosis not present

## 2017-02-20 DIAGNOSIS — I208 Other forms of angina pectoris: Secondary | ICD-10-CM | POA: Diagnosis not present

## 2017-05-09 DIAGNOSIS — G2581 Restless legs syndrome: Secondary | ICD-10-CM | POA: Diagnosis not present

## 2017-05-16 ENCOUNTER — Telehealth: Payer: Self-pay | Admitting: *Deleted

## 2017-05-16 NOTE — Telephone Encounter (Signed)
Attempted to call the patient to inform him he needs a sleep appointment with Dr Claiborne Billings. Medicare will not pay for his supplies because he has not had a face to face appointment within the last 6 months. Jonni Sanger with [facility] is willing to give him  "one time" supply, but he will have to pay out of pocket.  Prescription faxed to [facility] @ [phone]. I will attempt to contact patient again later at the number provided.

## 2017-05-22 DIAGNOSIS — G8929 Other chronic pain: Secondary | ICD-10-CM | POA: Diagnosis not present

## 2017-05-22 DIAGNOSIS — M1711 Unilateral primary osteoarthritis, right knee: Secondary | ICD-10-CM | POA: Diagnosis not present

## 2017-05-22 DIAGNOSIS — M25561 Pain in right knee: Secondary | ICD-10-CM | POA: Diagnosis not present

## 2017-06-15 DIAGNOSIS — N401 Enlarged prostate with lower urinary tract symptoms: Secondary | ICD-10-CM | POA: Diagnosis not present

## 2017-06-15 DIAGNOSIS — R3912 Poor urinary stream: Secondary | ICD-10-CM | POA: Diagnosis not present

## 2017-06-29 DIAGNOSIS — R3912 Poor urinary stream: Secondary | ICD-10-CM | POA: Diagnosis not present

## 2017-06-29 DIAGNOSIS — N401 Enlarged prostate with lower urinary tract symptoms: Secondary | ICD-10-CM | POA: Diagnosis not present

## 2017-07-24 DIAGNOSIS — F331 Major depressive disorder, recurrent, moderate: Secondary | ICD-10-CM | POA: Diagnosis not present

## 2017-07-24 DIAGNOSIS — N400 Enlarged prostate without lower urinary tract symptoms: Secondary | ICD-10-CM | POA: Diagnosis not present

## 2017-07-24 DIAGNOSIS — I10 Essential (primary) hypertension: Secondary | ICD-10-CM | POA: Diagnosis not present

## 2017-07-24 DIAGNOSIS — I208 Other forms of angina pectoris: Secondary | ICD-10-CM | POA: Diagnosis not present

## 2017-07-24 DIAGNOSIS — E782 Mixed hyperlipidemia: Secondary | ICD-10-CM | POA: Diagnosis not present

## 2017-07-24 DIAGNOSIS — E538 Deficiency of other specified B group vitamins: Secondary | ICD-10-CM | POA: Diagnosis not present

## 2017-07-24 DIAGNOSIS — E349 Endocrine disorder, unspecified: Secondary | ICD-10-CM | POA: Diagnosis not present

## 2017-07-24 DIAGNOSIS — Z79899 Other long term (current) drug therapy: Secondary | ICD-10-CM | POA: Diagnosis not present

## 2017-07-24 DIAGNOSIS — D649 Anemia, unspecified: Secondary | ICD-10-CM | POA: Diagnosis not present

## 2017-07-24 DIAGNOSIS — E119 Type 2 diabetes mellitus without complications: Secondary | ICD-10-CM | POA: Diagnosis not present

## 2017-07-24 DIAGNOSIS — N200 Calculus of kidney: Secondary | ICD-10-CM | POA: Diagnosis not present

## 2017-07-24 DIAGNOSIS — Z125 Encounter for screening for malignant neoplasm of prostate: Secondary | ICD-10-CM | POA: Diagnosis not present

## 2017-09-12 ENCOUNTER — Emergency Department (HOSPITAL_COMMUNITY)
Admission: EM | Admit: 2017-09-12 | Discharge: 2017-09-12 | Disposition: A | Discharge status: Home / Self Care | Payer: No Typology Code available for payment source | Attending: Emergency Medicine | Admitting: Emergency Medicine

## 2017-09-12 ENCOUNTER — Emergency Department (HOSPITAL_COMMUNITY): Payer: No Typology Code available for payment source

## 2017-09-12 ENCOUNTER — Encounter (HOSPITAL_COMMUNITY): Payer: Self-pay | Admitting: Family Medicine

## 2017-09-12 DIAGNOSIS — Y9389 Activity, other specified: Secondary | ICD-10-CM | POA: Insufficient documentation

## 2017-09-12 DIAGNOSIS — Y999 Unspecified external cause status: Secondary | ICD-10-CM | POA: Diagnosis not present

## 2017-09-12 DIAGNOSIS — Z7984 Long term (current) use of oral hypoglycemic drugs: Secondary | ICD-10-CM | POA: Insufficient documentation

## 2017-09-12 DIAGNOSIS — S0101XA Laceration without foreign body of scalp, initial encounter: Secondary | ICD-10-CM | POA: Insufficient documentation

## 2017-09-12 DIAGNOSIS — Z79899 Other long term (current) drug therapy: Secondary | ICD-10-CM | POA: Insufficient documentation

## 2017-09-12 DIAGNOSIS — I110 Hypertensive heart disease with heart failure: Secondary | ICD-10-CM | POA: Diagnosis not present

## 2017-09-12 DIAGNOSIS — R51 Headache: Secondary | ICD-10-CM | POA: Diagnosis not present

## 2017-09-12 DIAGNOSIS — S199XXA Unspecified injury of neck, initial encounter: Secondary | ICD-10-CM | POA: Diagnosis not present

## 2017-09-12 DIAGNOSIS — I509 Heart failure, unspecified: Secondary | ICD-10-CM | POA: Insufficient documentation

## 2017-09-12 DIAGNOSIS — Y929 Unspecified place or not applicable: Secondary | ICD-10-CM | POA: Insufficient documentation

## 2017-09-12 DIAGNOSIS — S0990XA Unspecified injury of head, initial encounter: Secondary | ICD-10-CM | POA: Diagnosis not present

## 2017-09-12 DIAGNOSIS — E119 Type 2 diabetes mellitus without complications: Secondary | ICD-10-CM | POA: Diagnosis not present

## 2017-09-12 LAB — CBG MONITORING, ED: GLUCOSE-CAPILLARY: 173 mg/dL — AB (ref 65–99)

## 2017-09-12 MED ORDER — LIDOCAINE-EPINEPHRINE-TETRACAINE (LET) SOLUTION
3.0000 mL | Freq: Once | NASAL | Status: AC
  Administered 2017-09-12: 3 mL via TOPICAL
  Filled 2017-09-12: qty 3

## 2017-09-12 NOTE — Discharge Instructions (Signed)
Take ibuprofen or tylenol as needed for pain.  Use ice packs or heating pads for muscle soreness or aches.  You will likely have continued muscle stiffness and soreness over the next several days. Follow up with your primary care doctor if your headache or muscle pain is not improving in a week.  Return to the ER if you are having vision changes, vomiting, loss of bowel or bladder control, numbness, or any new or worsening symptoms.
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
## 2017-09-12 NOTE — ED Notes (Signed)
Witnessed signature from patient 2 times, signature pad not working.
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
## 2017-09-12 NOTE — ED Notes (Signed)
derma bond at bedside
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
## 2017-09-12 NOTE — ED Notes (Signed)
Patient took phone call so this nurse stepped out.
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
## 2017-10-18 DIAGNOSIS — R197 Diarrhea, unspecified: Secondary | ICD-10-CM | POA: Diagnosis not present

## 2017-10-18 DIAGNOSIS — K521 Toxic gastroenteritis and colitis: Secondary | ICD-10-CM | POA: Diagnosis not present

## 2018-02-20 DIAGNOSIS — J0100 Acute maxillary sinusitis, unspecified: Secondary | ICD-10-CM | POA: Diagnosis not present

## 2018-02-20 DIAGNOSIS — J111 Influenza due to unidentified influenza virus with other respiratory manifestations: Secondary | ICD-10-CM | POA: Diagnosis not present

## 2018-02-20 DIAGNOSIS — R509 Fever, unspecified: Secondary | ICD-10-CM | POA: Diagnosis not present

## 2018-03-07 DIAGNOSIS — S50811A Abrasion of right forearm, initial encounter: Secondary | ICD-10-CM | POA: Diagnosis not present

## 2018-04-16 DIAGNOSIS — R945 Abnormal results of liver function studies: Secondary | ICD-10-CM | POA: Diagnosis not present

## 2018-04-16 DIAGNOSIS — E782 Mixed hyperlipidemia: Secondary | ICD-10-CM | POA: Diagnosis not present

## 2018-04-16 DIAGNOSIS — N200 Calculus of kidney: Secondary | ICD-10-CM | POA: Diagnosis not present

## 2018-04-16 DIAGNOSIS — Z79899 Other long term (current) drug therapy: Secondary | ICD-10-CM | POA: Diagnosis not present

## 2018-04-16 DIAGNOSIS — D649 Anemia, unspecified: Secondary | ICD-10-CM | POA: Diagnosis not present

## 2018-04-16 DIAGNOSIS — E119 Type 2 diabetes mellitus without complications: Secondary | ICD-10-CM | POA: Diagnosis not present

## 2018-04-16 DIAGNOSIS — Z125 Encounter for screening for malignant neoplasm of prostate: Secondary | ICD-10-CM | POA: Diagnosis not present

## 2018-04-16 DIAGNOSIS — I10 Essential (primary) hypertension: Secondary | ICD-10-CM | POA: Diagnosis not present

## 2018-04-16 DIAGNOSIS — G4733 Obstructive sleep apnea (adult) (pediatric): Secondary | ICD-10-CM | POA: Diagnosis not present

## 2018-04-16 DIAGNOSIS — N400 Enlarged prostate without lower urinary tract symptoms: Secondary | ICD-10-CM | POA: Diagnosis not present

## 2018-04-16 DIAGNOSIS — E538 Deficiency of other specified B group vitamins: Secondary | ICD-10-CM | POA: Diagnosis not present

## 2018-04-16 DIAGNOSIS — I208 Other forms of angina pectoris: Secondary | ICD-10-CM | POA: Diagnosis not present

## 2018-04-16 DIAGNOSIS — F331 Major depressive disorder, recurrent, moderate: Secondary | ICD-10-CM | POA: Diagnosis not present

## 2018-06-21 DIAGNOSIS — R233 Spontaneous ecchymoses: Secondary | ICD-10-CM | POA: Diagnosis not present

## 2018-06-21 DIAGNOSIS — L720 Epidermal cyst: Secondary | ICD-10-CM | POA: Diagnosis not present

## 2018-06-21 DIAGNOSIS — L570 Actinic keratosis: Secondary | ICD-10-CM | POA: Diagnosis not present

## 2018-06-21 DIAGNOSIS — L821 Other seborrheic keratosis: Secondary | ICD-10-CM | POA: Diagnosis not present

## 2018-07-08 DIAGNOSIS — L720 Epidermal cyst: Secondary | ICD-10-CM | POA: Diagnosis not present

## 2018-07-25 DIAGNOSIS — C44229 Squamous cell carcinoma of skin of left ear and external auricular canal: Secondary | ICD-10-CM | POA: Diagnosis not present

## 2018-10-22 DIAGNOSIS — R5381 Other malaise: Secondary | ICD-10-CM | POA: Diagnosis not present

## 2018-10-22 DIAGNOSIS — I10 Essential (primary) hypertension: Secondary | ICD-10-CM | POA: Diagnosis not present

## 2018-10-22 DIAGNOSIS — E782 Mixed hyperlipidemia: Secondary | ICD-10-CM | POA: Diagnosis not present

## 2018-10-22 DIAGNOSIS — E349 Endocrine disorder, unspecified: Secondary | ICD-10-CM | POA: Diagnosis not present

## 2018-10-22 DIAGNOSIS — R5383 Other fatigue: Secondary | ICD-10-CM | POA: Diagnosis not present

## 2018-10-22 DIAGNOSIS — E538 Deficiency of other specified B group vitamins: Secondary | ICD-10-CM | POA: Diagnosis not present

## 2018-10-22 DIAGNOSIS — R600 Localized edema: Secondary | ICD-10-CM | POA: Diagnosis not present

## 2018-10-22 DIAGNOSIS — E119 Type 2 diabetes mellitus without complications: Secondary | ICD-10-CM | POA: Diagnosis not present

## 2018-10-22 DIAGNOSIS — D649 Anemia, unspecified: Secondary | ICD-10-CM | POA: Diagnosis not present

## 2018-10-22 DIAGNOSIS — R945 Abnormal results of liver function studies: Secondary | ICD-10-CM | POA: Diagnosis not present

## 2018-10-22 DIAGNOSIS — I872 Venous insufficiency (chronic) (peripheral): Secondary | ICD-10-CM | POA: Diagnosis not present

## 2018-11-01 DIAGNOSIS — M79671 Pain in right foot: Secondary | ICD-10-CM | POA: Diagnosis not present

## 2018-11-01 DIAGNOSIS — M25561 Pain in right knee: Secondary | ICD-10-CM | POA: Diagnosis not present

## 2018-11-15 DIAGNOSIS — M79671 Pain in right foot: Secondary | ICD-10-CM | POA: Diagnosis not present

## 2019-02-27 DIAGNOSIS — S60512A Abrasion of left hand, initial encounter: Secondary | ICD-10-CM | POA: Diagnosis not present

## 2019-04-22 DIAGNOSIS — R5383 Other fatigue: Secondary | ICD-10-CM | POA: Diagnosis not present

## 2019-04-22 DIAGNOSIS — R945 Abnormal results of liver function studies: Secondary | ICD-10-CM | POA: Diagnosis not present

## 2019-04-22 DIAGNOSIS — E119 Type 2 diabetes mellitus without complications: Secondary | ICD-10-CM | POA: Diagnosis not present

## 2019-04-22 DIAGNOSIS — Z79899 Other long term (current) drug therapy: Secondary | ICD-10-CM | POA: Diagnosis not present

## 2019-04-22 DIAGNOSIS — E782 Mixed hyperlipidemia: Secondary | ICD-10-CM | POA: Diagnosis not present

## 2019-04-22 DIAGNOSIS — I208 Other forms of angina pectoris: Secondary | ICD-10-CM | POA: Diagnosis not present

## 2019-04-22 DIAGNOSIS — D649 Anemia, unspecified: Secondary | ICD-10-CM | POA: Diagnosis not present

## 2019-04-22 DIAGNOSIS — N400 Enlarged prostate without lower urinary tract symptoms: Secondary | ICD-10-CM | POA: Diagnosis not present

## 2019-04-22 DIAGNOSIS — E349 Endocrine disorder, unspecified: Secondary | ICD-10-CM | POA: Diagnosis not present

## 2019-04-22 DIAGNOSIS — F331 Major depressive disorder, recurrent, moderate: Secondary | ICD-10-CM | POA: Diagnosis not present

## 2019-04-22 DIAGNOSIS — I10 Essential (primary) hypertension: Secondary | ICD-10-CM | POA: Diagnosis not present

## 2019-04-22 DIAGNOSIS — R5381 Other malaise: Secondary | ICD-10-CM | POA: Diagnosis not present

## 2019-04-22 DIAGNOSIS — G4733 Obstructive sleep apnea (adult) (pediatric): Secondary | ICD-10-CM | POA: Diagnosis not present

## 2019-04-22 DIAGNOSIS — E291 Testicular hypofunction: Secondary | ICD-10-CM | POA: Diagnosis not present

## 2020-02-09 ENCOUNTER — Inpatient Hospital Stay (HOSPITAL_COMMUNITY): Admission: RE | Admit: 2020-02-09 | Payer: Medicare Other | Source: Ambulatory Visit

## 2020-02-09 ENCOUNTER — Other Ambulatory Visit (HOSPITAL_COMMUNITY): Payer: Self-pay | Admitting: Orthopedic Surgery

## 2020-02-09 DIAGNOSIS — M79662 Pain in left lower leg: Secondary | ICD-10-CM

## 2020-02-09 DIAGNOSIS — M7989 Other specified soft tissue disorders: Secondary | ICD-10-CM

## 2020-02-19 ENCOUNTER — Encounter: Payer: Self-pay | Admitting: Cardiology

## 2020-02-25 ENCOUNTER — Other Ambulatory Visit: Payer: Self-pay

## 2020-02-25 ENCOUNTER — Ambulatory Visit (HOSPITAL_COMMUNITY)
Admission: RE | Admit: 2020-02-25 | Discharge: 2020-02-25 | Disposition: A | Discharge status: Home / Self Care | Payer: Medicare Other | Source: Ambulatory Visit | Attending: Cardiovascular Disease | Admitting: Cardiovascular Disease

## 2020-02-25 ENCOUNTER — Other Ambulatory Visit (HOSPITAL_COMMUNITY): Payer: Self-pay | Admitting: Orthopedic Surgery

## 2020-02-25 DIAGNOSIS — M79604 Pain in right leg: Secondary | ICD-10-CM | POA: Insufficient documentation

## 2020-02-25 DIAGNOSIS — M7989 Other specified soft tissue disorders: Secondary | ICD-10-CM | POA: Insufficient documentation

## 2020-03-09 ENCOUNTER — Encounter: Payer: Self-pay | Admitting: Cardiology

## 2020-03-09 ENCOUNTER — Ambulatory Visit (INDEPENDENT_AMBULATORY_CARE_PROVIDER_SITE_OTHER): Payer: Medicare Other | Admitting: Cardiology

## 2020-03-09 ENCOUNTER — Other Ambulatory Visit: Payer: Self-pay

## 2020-03-09 VITALS — BP 134/76 | HR 68 | Temp 97.8°F | Ht 71.5 in | Wt 250.0 lb

## 2020-03-09 DIAGNOSIS — R9431 Abnormal electrocardiogram [ECG] [EKG]: Secondary | ICD-10-CM

## 2020-03-09 DIAGNOSIS — E1169 Type 2 diabetes mellitus with other specified complication: Secondary | ICD-10-CM

## 2020-03-09 DIAGNOSIS — I10 Essential (primary) hypertension: Secondary | ICD-10-CM | POA: Diagnosis not present

## 2020-03-09 DIAGNOSIS — Z9989 Dependence on other enabling machines and devices: Secondary | ICD-10-CM

## 2020-03-09 DIAGNOSIS — E781 Pure hyperglyceridemia: Secondary | ICD-10-CM | POA: Diagnosis not present

## 2020-03-09 DIAGNOSIS — E669 Obesity, unspecified: Secondary | ICD-10-CM

## 2020-03-09 DIAGNOSIS — G4733 Obstructive sleep apnea (adult) (pediatric): Secondary | ICD-10-CM

## 2020-03-09 DIAGNOSIS — Z01810 Encounter for preprocedural cardiovascular examination: Secondary | ICD-10-CM

## 2020-03-09 NOTE — Patient Instructions (Signed)
Medication Instructions:  No changes  *If you need a refill on your cardiac medications before your next appointment, please call your pharmacy*   Lab Work: Not needed .   Testing/Procedures: Will be schedule at [facility] [place] has requested that you have an echocardiogram. Echocardiography is a painless test that uses sound waves to create images of your heart. It provides your doctor with information about the size and shape of your heart and how well your heart's chambers and valves are working. This procedure takes approximately one hour. There are no restrictions for this procedure.     Follow-Up: At [facility], you and your health needs are our priority.  As part of our continuing mission to provide you with exceptional heart care, we have created designated Provider Care Teams.  These Care Teams include your primary Cardiologist (physician) and Advanced Practice Providers (APPs -  Physician Assistants and Nurse Practitioners) who all work together to provide you with the care you need, when you need it.  We recommend signing up for the patient portal called "MyChart".  Sign up information is provided on this After Visit Summary.  MyChart is used to connect with patients for Virtual Visits (Telemedicine).  Patients are able to view lab/test results, encounter notes, upcoming appointments, etc.  Non-urgent messages can be sent to your provider as well.   To learn more about what you can do with MyChart, go to [email or web address].    Your next appointment:     as needed if test is normal  The format for your next appointment:   Either In Person or Virtual  Provider:   Glenetta Hew, MD   Other Instructions

## 2020-03-09 NOTE — Progress Notes (Signed)
Primary Care Provider: Raina Mina., MD Cardiologist: No primary care provider on file. Electrophysiologist: None  Clinic Note: Chief Complaint  Patient presents with  . Pre-op Exam    Pending right knee surgery  . Coronary Artery Disease    Nonobstructive disease noted by cath.  . Abnormal ECG    Bifascicular block    HPI:    Ethan Goodman. is an obese 74 y.o. male with a history of DM-2, hypertriglyceridemia, hypertension and OSA on CPAP who is being seen today for the evaluation of ABNORMAL EKG/PREOPERATIVE EVALUATION at the request of Raina Mina., MD./Melissa Leeroy Cha, NP  Mahala Menghini. was last seen in November 2015 for at that time annual follow-up with no major complaints.  He just was stressed out with his at that time wife's health condition.  She was suffering from for now to be terminal cancer. --> He was doing well with CPAP.  Chronic mild lower extremity edema.  No PND orthopnea.  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  No chest pain or tightness.  No palpitations. -> Had a little more fatigue than usual since no longer taking replacement testosterone  No meds  Recent Hospitalizations:   1224/25 2020 admitted [facility] (transferred from [facility])-> acute hypoxic restaurant failure secondary to COVID-19 infection.  Treated with course of remdesivir and dexamethasone.  EKG showed bifascicular block.  Benazepril and KCl discontinued secondary to hyperkalemia -> started on Lasix.  Discharged on 10-day course of dexamethasone as well as home oxygen   Reviewed  CV studies:    The following studies were reviewed today: (if available, images/films reviewed: From Epic Chart or Care Everywhere)  10/09/2019-lower extremity venous Dopplers: No obvious DVT  10/16/2019 Transthoracic Echo: EF 55-60% with normal wall motion.  Mild LVH, prolonged LV relaxation (GR 1 DD)  with mild biatrial dilation.  Normal RV size and function   Interval History:   Ethan Goodman. presents here today with his new wife for cardiology evaluation because of an abnormal EKG.  He needs preop evaluation for right knee surgery.  He is very limited by his right knee pain and is not able to really do much in the way of any exercise.  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  In doing that, he is limited by knee pain but not dyspnea or chest discomfort.  He does have chronic lower extremity swelling, but the right is worse than left because of his knee pain.  CV Review of Symptoms (Summary) Cardiovascular ROS: positive for - dyspnea on exertion, edema and Both are relatively stable.  He is also quite deconditioned. negative for - chest pain, irregular heartbeat, orthopnea, palpitations, paroxysmal nocturnal dyspnea, rapid heart rate, shortness of breath or Syncope/near syncope, TIA/amaurosis fugax, claudication  The patient does not have symptoms concerning for COVID-19 infection (fever, chills, cough, or new shortness of breath).  The patient is practicing social distancing & Masking.    REVIEWED OF SYSTEMS   ROS   I have reviewed and (if needed) personally updated the patient's problem list, medications, allergies, past medical and surgical  history, social and family history.   PAST MEDICAL HISTORY   Past Medical History:  Diagnosis Date  . Acute respiratory failure due to COVID-19 ([place])   . CHF (congestive heart failure) ([place])   . Degenerative disc disease, lumbar   . Diabetes mellitus type 2 in obese ([place])   . Diverticulosis   . Erectile dysfunction   . High cholesterol   . Hypertension   . Hypertriglyceridemia without  hypercholesterolemia    Last noted cholesterol the total showing 70, HDL 50, LDL unable to be calculated due to triglycerides of 10/18/1997  . Low testosterone    On replacement supplement  . Obesity (BMI 30-39.9)   . OSA on CPAP 09/24/2012   AHI of 78.1 --previously followed by Dr. Claiborne Billings, last visit February 2014 -- marked improvement in daytime sleepiness. Able to sleep through the night  . Stomach problems   . Venous insufficiency of both lower extremities     PAST SURGICAL HISTORY   Past Surgical History:  Procedure Laterality Date  . BACK SURGERY    . CARDIAC CATHETERIZATION  2003   Nonobstructive CAD  . CPAP titration  08/22/2012  . Polysomnogram  08/08/12   Diagnostic  . TRANSTHORACIC ECHOCARDIOGRAM  07/2012   Moderate concentric LVH, normal EF, grade 1 diastolic dysfunction and mild aortic sclerosis.    MEDICATIONS/ALLERGIES   Current Meds  Medication Sig  . citalopram (CELEXA) 40 MG tablet Take 40 mg by mouth daily.  Marland Kitchen doxazosin (CARDURA) 4 MG tablet Take 4 mg by mouth at bedtime.   . finasteride (PROSCAR) 5 MG tablet Take 5 mg by mouth daily.   Marland Kitchen glipiZIDE (GLUCOTROL) 5 MG tablet Take 5 mg by mouth 2 (two) times daily before a meal.  . metFORMIN (GLUCOPHAGE) 500 MG tablet Take 500 mg by mouth 2 (two) times daily with a meal.  . Multiple Vitamins-Minerals (MULTIVITAMIN WITH MINERALS) tablet Take 1 tablet by mouth daily.  . NON FORMULARY C-PAP at bedtime  . tamsulosin (FLOMAX) 0.4 MG CAPS capsule Take 0.4 mg by mouth daily after supper.   . testosterone cypionate (DEPOTESTOTERONE CYPIONATE) 100 MG/ML injection Inject 200 mg into the muscle every 14 (fourteen) days.   . vitamin B-12 (CYANOCOBALAMIN) 1000 MCG tablet Take 1,000 mcg by mouth daily.  . [DISCONTINUED] benazepril (LOTENSIN) 20 MG tablet TAKE ONE TABLET BY MOUTH ONCE DAILY  . [DISCONTINUED] citalopram (CELEXA) 20 MG tablet Take 20 mg by mouth 2 (two) times daily.  . [DISCONTINUED] citalopram (CELEXA) 40 MG  tablet Take 40 mg by mouth daily.   . [DISCONTINUED] glipiZIDE (GLUCOTROL XL) 5 MG 24 hr tablet Take 5 mg by mouth daily with breakfast.   . [DISCONTINUED] HYDROcodone-acetaminophen (NORCO/VICODIN) 5-325 MG per tablet Take 2 tablets by mouth every 4 (four) hours as needed for pain.  . [DISCONTINUED] Ibuprofen-Diphenhydramine Cit (ADVIL PM) 200-38 MG TABS Take 1 tablet by mouth at bedtime as needed (sleep).  . [DISCONTINUED] Multiple Vitamin (MULTIVITAMIN) LIQD Take 5 mLs by mouth daily.  . [DISCONTINUED] OVER THE COUNTER MEDICATION Skinny fiber dietary supplment  . [DISCONTINUED] potassium citrate (UROCIT-K) 10 MEQ (1080 MG) SR tablet Take 10 mEq by mouth 2 (two) times daily.  . [DISCONTINUED] testosterone cypionate (DEPOTESTOTERONE CYPIONATE) 200 MG/ML injection   . [DISCONTINUED] zinc gluconate 50 MG tablet Take 50 mg by mouth. 2tablets    Allergies  Allergen Reactions  . Lorazepam Other (See Comments)    Hallucination   . Morphine And Related Other (See Comments)    Hallucinations  SOCIAL HISTORY/FAMILY HISTORY   Reviewed in Epic:  Pertinent findings:    OBJCTIVE -PE, EKG, labs   Wt Readings from Last 3 Encounters:  03/09/20 250 lb (113.4 kg)  09/12/17 250 lb (113.4 kg)  08/17/14 250 lb (113.4 kg)    Physical Exam: BP 134/76 (BP Location: Right Arm, Patient Position: Sitting, Cuff Size: Large)   Pulse 68   Temp 97.8 F (36.6 C)   Ht 5' 11.5" (1.816 m)   Wt 250 lb (113.4 kg)   BMI 34.38 kg/m  Physical Exam  Constitutional: He is oriented to person, place, and time. He appears well-developed and well-nourished. No distress.  Moderately obese gentleman.  Well-groomed  HENT:  Head: Normocephalic and atraumatic.  Eyes: EOM are normal.  Neck: No hepatojugular reflux and no JVD present. Carotid bruit is not present.  Cardiovascular: Regular rhythm, S1 normal, S2 normal and intact distal pulses.  No extrasystoles are present. PMI is not displaced (Difficulty palpate).  Exam reveals distant heart sounds. Exam reveals no gallop (Cannot exclude soft S4).  No murmur heard. Pulmonary/Chest: Effort normal and breath sounds normal. No respiratory distress. He has no wheezes. He has no rales.  Abdominal: Soft. Bowel sounds are normal. He exhibits no distension. There is no abdominal tenderness. There is no rebound.  Protuberant, obese.  Difficult to assess HSM  Musculoskeletal:        General: Edema (1-2+ bilateral LE.) present. Normal range of motion.     Cervical back: Normal range of motion and neck supple.  Neurological: He is alert and oriented to person, place, and time. No cranial nerve deficit.  Skin:  Bilateral leg venous stasis-mild edema and stasis dermatitis changes.  Psychiatric: He has a normal mood and affect. His behavior is normal. Judgment and thought content normal.  Vitals reviewed.   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  RBBB with LAFB.;   Narrative Interpretation: Relatively stable compared to attached EKG.   Recent Labs: Feb 19, 2020 (Care Everywhere)  Na+ 140, K+ 4.4, Cl- 103, HCO3-30, BUN 22, Cr 1.1, Glu 166, Ca2+ 9.5; AST 16, ALT 14, AlkP 61  CBC: W 8.5, H/H 12.6/37.1, Plt 210  TC 148, TG 323, HDL 34, LDL-direct 80; TSH 2.291; A1c 7.3   ASSESSMENT/PLAN    Problem List Items Addressed This Visit    Diabetes mellitus type 2 in obese (HCC) (Chronic)    Only on oral medications.  A1c of 7.3 is relatively well controlled.  Not on insulin.  Consider SGLT2-I      OSA on CPAP (Chronic)    Continues to use CPAP.  Has not had follow-up evaluation.      Relevant Orders   EKG 12-Lead (Completed)   ECHOCARDIOGRAM COMPLETE   Essential hypertension (Chronic)    Borderline pressure today.  He had been on an  ACE inhibitor in the past and is not currently on it because of hyperkalemia.  Would avoid AV nodal agents such as beta-blockers despite potential cardiovascular benefit because of bifascicular block.  Consider dihydropyridine calcium channel blocker or potentially restarting ACE inhibitor.      Hypertriglyceridemia without hypercholesterolemia (Chronic)    Relatively reasonable direct LDL score in a patient who is not actively on any therapy for hyperlipidemia or hypertriglyceridemia.  Triglycerides are elevated as probably related to his diabetes.  Consider Vascepa      Nonspecific abnormal electrocardiogram (ECG) (EKG)    Bifascicular block noted on EKG.  Would avoid AV nodal agents.  Ordering 2D echocardiogram to reassess EF and wall motion.  Would not pursue ischemic evaluation in the absence of symptoms.      Relevant Orders   EKG 12-Lead (Completed)   ECHOCARDIOGRAM COMPLETE   Pre-operative cardiovascular examination - Primary    Izrael presents here today for preop evaluation because of the EKG showing bifascicular block.  Bifascicular lock was seen during his hospital stay back in December at [facility].  It is new from 2015 that much is changed in that time.  I was not aware that he had an echocardiogram done during his hospital stay [facility].  We have ordered an echocardiogram to reassess cardiac function after his COVID-19 and to determine if there is any regional wall motion abnormalities.  He is not having any signs symptoms of active CHF or angina.  His edema is mostly related to chronic venous stasis and has been that way for years.  As for preop evaluation, knee surgery is relatively low risk surgery from a cardiac standpoint and therefore in the absence of any active symptoms I would not recommend any further evaluation besides a baseline echocardiogram.  Stress test would not alter recommendations in the absence of symptoms for low risk test.  Based on the revised  cardiac risk index score, he has stable renal function, diabetes not on insulin, no prior stroke and no active angina or heart failure symptoms with a normal echocardiogram 6 months ago (being rechecked now).  For low risk surgery he would be considered low risk patient less than 30% chance of adverse cardiovascular outcome.  Based on the ACC/AHA guidelines for preop evaluation, for elective low risk surgery on the patient without active symptoms, would not recommend further testing as it would not change management.      Relevant Orders   EKG 12-Lead (Completed)   ECHOCARDIOGRAM COMPLETE       COVID-19 Education: The signs and symptoms of COVID-19 were discussed with the patient and how to seek care for testing (follow up with PCP or arrange E-visit).   The importance of social distancing and COVID-19 vaccination was discussed today.  I spent a total of 25 minutes with the patient. >  50% of the time was spent in direct patient consultation.  Additional time spent with chart review  / charting (studies, outside notes, etc): 14 Total Time: 39 min   Current medicines are reviewed at length with the patient today.  (+/- concerns) n/a  Notice: This dictation was prepared with Dragon dictation along with smaller phrase technology. Any transcriptional errors that result from this process are unintentional and may not be corrected upon review.  Patient Instructions / Medication Changes & Studies & Tests Ordered   Patient Instructions  Medication Instructions:  No changes  *If you need a refill on your cardiac medications before your next appointment, please call your pharmacy*   Lab Work: Not needed .   Testing/Procedures: Will be schedule at [facility] [place] has requested that you have an echocardiogram. Echocardiography is a painless test that uses sound waves to create images of your heart. It provides your doctor with information about the size  and shape of your heart and how well your heart's chambers and valves are working. This procedure  takes approximately one hour. There are no restrictions for this procedure.     Follow-Up: At [facility], you and your health needs are our priority.  As part of our continuing mission to provide you with exceptional heart care, we have created designated Provider Care Teams.  These Care Teams include your primary Cardiologist (physician) and Advanced Practice Providers (APPs -  Physician Assistants and Nurse Practitioners) who all work together to provide you with the care you need, when you need it.  We recommend signing up for the patient portal called "MyChart".  Sign up information is provided on this After Visit Summary.  MyChart is used to connect with patients for Virtual Visits (Telemedicine).  Patients are able to view lab/test results, encounter notes, upcoming appointments, etc.  Non-urgent messages can be sent to your provider as well.   To learn more about what you can do with MyChart, go to [email or web address].    Your next appointment:     as needed if test is normal  The format for your next appointment:   Either In Person or Virtual  Provider:   Glenetta Hew, MD   Other Instructions     Studies Ordered:   Orders Placed This Encounter  Procedures  . EKG 12-Lead  . ECHOCARDIOGRAM COMPLETE     Glenetta Hew, M.D., M.S. Interventional Cardiologist   Pager # [phone] Phone # [phone] [address] [place], [place] [address]   Thank you for choosing [facility] at [facility]!!

## 2020-03-15 ENCOUNTER — Encounter: Payer: Self-pay | Admitting: Cardiology

## 2020-03-15 DIAGNOSIS — Z01810 Encounter for preprocedural cardiovascular examination: Secondary | ICD-10-CM | POA: Insufficient documentation

## 2020-03-15 NOTE — Assessment & Plan Note (Signed)
Ethan Goodman presents here today for preop evaluation because of the EKG showing bifascicular block.  Bifascicular lock was seen during his hospital stay back in December at [facility].  It is new from 2015 that much is changed in that time.  I was not aware that he had an echocardiogram done during his hospital stay [facility].  We have ordered an echocardiogram to reassess cardiac function after his COVID-19 and to determine if there is any regional wall motion abnormalities.  He is not having any signs symptoms of active CHF or angina.  His edema is mostly related to chronic venous stasis and has been that way for years.  As for preop evaluation, knee surgery is relatively low risk surgery from a cardiac standpoint and therefore in the absence of any active symptoms I would not recommend any further evaluation besides a baseline echocardiogram.  Stress test would not alter recommendations in the absence of symptoms for low risk test.  Based on the revised cardiac risk index score, he has stable renal function, diabetes not on insulin, no prior stroke and no active angina or heart failure symptoms with a normal echocardiogram 6 months ago (being rechecked now).  For low risk surgery he would be considered low risk patient less than 30% chance of adverse cardiovascular outcome.  Based on the ACC/AHA guidelines for preop evaluation, for elective low risk surgery on the patient without active symptoms, would not recommend further testing as it would not change management.

## 2020-03-15 NOTE — Assessment & Plan Note (Addendum)
Bifascicular block noted on EKG.  Would avoid AV nodal agents.  Ordering 2D echocardiogram to reassess EF and wall motion.  Would not pursue ischemic evaluation in the absence of symptoms.

## 2020-03-15 NOTE — Assessment & Plan Note (Signed)
Relatively reasonable direct LDL score in a patient who is not actively on any therapy for hyperlipidemia or hypertriglyceridemia.  Triglycerides are elevated as probably related to his diabetes.  Consider Vascepa

## 2020-03-15 NOTE — Assessment & Plan Note (Addendum)
Borderline pressure today.  He had been on an ACE inhibitor in the past and is not currently on it because of hyperkalemia.  Would avoid AV nodal agents such as beta-blockers despite potential cardiovascular benefit because of bifascicular block.  Consider dihydropyridine calcium channel blocker or potentially restarting ACE inhibitor.

## 2020-03-15 NOTE — Assessment & Plan Note (Signed)
Continues to use CPAP.  Has not had follow-up evaluation.

## 2020-03-15 NOTE — Assessment & Plan Note (Addendum)
Only on oral medications.  A1c of 7.3 is relatively well controlled.  Not on insulin.  Consider SGLT2-I

## 2020-03-25 ENCOUNTER — Telehealth: Payer: Self-pay | Admitting: Cardiology

## 2020-03-25 NOTE — Telephone Encounter (Signed)
Betha from [facility] [place] calling to check on status of the patient's clearance. He had an appointment 03/09/2020 for the clearance.

## 2020-03-25 NOTE — Telephone Encounter (Signed)
Per office note Pt is at low risk from a cardiac standpoint .Office note from 03-09-20 faxed to [phone]./cy

## 2020-04-02 ENCOUNTER — Other Ambulatory Visit (HOSPITAL_COMMUNITY): Payer: No Typology Code available for payment source

## 2020-04-02 ENCOUNTER — Encounter (HOSPITAL_COMMUNITY): Payer: Self-pay

## 2020-04-02 NOTE — Progress Notes (Signed)
Verified appointment "no show" status with Thomes Dinning via phone call from [facility] at 15:48. Mr. Ethan Goodman arrived at [facility] office at 15:48. He will not be able to arrive to the [facility] location to be seen with adequate time to perform echo.

## 2020-04-05 ENCOUNTER — Other Ambulatory Visit: Payer: Self-pay

## 2020-04-05 ENCOUNTER — Ambulatory Visit (HOSPITAL_COMMUNITY)
Admission: RE | Admit: 2020-04-05 | Discharge: 2020-04-05 | Disposition: A | Discharge status: Home / Self Care | Payer: Medicare Other | Source: Ambulatory Visit | Attending: Cardiology | Admitting: Cardiology

## 2020-04-05 DIAGNOSIS — G4733 Obstructive sleep apnea (adult) (pediatric): Secondary | ICD-10-CM | POA: Diagnosis not present

## 2020-04-05 DIAGNOSIS — E785 Hyperlipidemia, unspecified: Secondary | ICD-10-CM | POA: Insufficient documentation

## 2020-04-05 DIAGNOSIS — I509 Heart failure, unspecified: Secondary | ICD-10-CM | POA: Diagnosis not present

## 2020-04-05 DIAGNOSIS — I110 Hypertensive heart disease with heart failure: Secondary | ICD-10-CM | POA: Diagnosis not present

## 2020-04-05 DIAGNOSIS — E119 Type 2 diabetes mellitus without complications: Secondary | ICD-10-CM | POA: Insufficient documentation

## 2020-04-05 DIAGNOSIS — Z9989 Dependence on other enabling machines and devices: Secondary | ICD-10-CM | POA: Insufficient documentation

## 2020-04-05 DIAGNOSIS — R9431 Abnormal electrocardiogram [ECG] [EKG]: Secondary | ICD-10-CM

## 2020-04-05 DIAGNOSIS — Z01810 Encounter for preprocedural cardiovascular examination: Secondary | ICD-10-CM | POA: Diagnosis present

## 2020-04-05 NOTE — Progress Notes (Signed)
  Echocardiogram TTE has been performed.  Jannett Celestine 04/05/2020, 2:51 PM

## 2020-04-22 ENCOUNTER — Telehealth: Payer: Self-pay | Admitting: *Deleted

## 2020-04-22 NOTE — Telephone Encounter (Signed)
   [facility] Pre-operative Risk Assessment    HEARTCARE STAFF: - Please ensure there is not already an duplicate clearance open for this procedure. - Under Visit Info/Reason for Call, type in Other and utilize the format Clearance MM/DD/YY or Clearance TBD. Do not use dashes or single digits. - If request is for dental extraction, please clarify the # of teeth to be extracted.  Request for surgical clearance:  1. What type of surgery is being performed? RIGHT TOTAL KNEE ARTHROPLASTY   2. When is this surgery scheduled? 05/13/20   3. What type of clearance is required (medical clearance vs. Pharmacy clearance to hold med vs. Both)? MEDICAL  4. Are there any medications that need to be held prior to surgery and how long? NONE LISTED   5. Practice name and name of physician performing surgery? [facility]; DR. Rod Can   6. What is the office phone number? [phone]   7.   What is the office fax number? [phone] ATTN: ASHLEY HILTON  8.   Anesthesia type (None, local, MAC, general) ? SPINAL   Julaine Hua 04/22/2020, 4:04 PM  _________________________________________________________________   (provider comments below)

## 2020-04-23 NOTE — Telephone Encounter (Signed)
   Primary Cardiologist: Dr. Glenetta Hew  Chart reviewed as part of pre-operative protocol coverage. Given past medical history and time since last visit, based on ACC/AHA guidelines, Ethan Goodman. would be at acceptable risk for the planned procedure without further cardiovascular testing.   I will route this recommendation to the requesting party via Epic fax function and remove from pre-op pool.  Please call with questions.   Patient was seen recently and cleared by Dr. Ellyn Hack. Repeat echocardiogram showed normal EF without wall motion abnormality. Per Dr. Ellyn Hack, he is a low risk patient for the intended procedure.   Almyra Deforest, [place] 04/23/2020, 9:39 AM

## 2020-04-26 ENCOUNTER — Ambulatory Visit: Payer: Self-pay | Admitting: Student

## 2020-04-30 NOTE — Patient Instructions (Addendum)
DUE TO COVID-19 ONLY ONE VISITOR ARE ALLOWED TO COME WITH YOU AND STAY IN THE WAITING ROOM ONLY DURING PRE OP AND  PROCEDURE. THEN TWO VISITORS MAY VISIT WITH YOU IN YOUR PRIVATE ROOM DURING VISITING HOURS ONLY!! (10AM-8PM)   COVID SWAB TESTING MUST BE COMPLETED ON:    05-10-20 @ 2:55 PM [address], [place] -Former [facility] enter pre surgical testing line (Must self quarantine after testing. Follow instructions on handout.)              Your procedure is scheduled on:   05-13-20   Report to [facility] Main  Entrance   Report to admitting at 10:00 AM   Call this number if you have problems the morning of surgery [phone]   After Midnight, You may have liquids until 9:30 AM day of surgery.   Complete one G2 drink the morning of surgery at  9:30 AM  the day of surgery.   CLEAR LIQUID DIET  Foods Allowed                                                                     Foods Excluded  Water, Black Coffee and tea, regular and decaf           liquids that you cannot  Plain Jell-O in any flavor  (No red)                                    see through such as: Fruit ices (not with fruit pulp)                                      milk, soups, orange juice  Iced Popsicles (No red)                                     All solid food                                   Apple juices Sports drinks like Gatorade (No red) Lightly seasoned clear broth or consume(fat free) Sugar, honey syrup     Take these medicines the morning of surgery with A SIP OF WATER: None   Oral Hygiene is also important to reduce your risk of infection.                                     Remember - BRUSH YOUR TEETH THE MORNING OF SURGERY WITH YOUR REGULAR TOOTHPASTE.  AFTERWARDS NO WATER, GUM,  CANDY OR MINTS.                   You may not have any metal on your body including jewelry, and body piercings              Do not wear lotions, powders, perfumes/cologne, or deodorant  Men may shave face and neck.   Do not bring valuables to the hospital. [place].   Contacts, dentures or bridgework may not be worn into surgery.   Bring small overnight bag day of surgery.    Special Instructions: Please bring your mask and tubing for your CPAP machine              Please read over the following fact sheets you were given: IF YOU HAVE QUESTIONS ABOUT YOUR PRE OP [place]  [phone] ________________________________________________________________________    How to Manage Your Diabetes Before and After Surgery  Why is it important to control my blood sugar before and after surgery? . Improving blood sugar levels before and after surgery helps healing and can limit problems. . A way of improving blood sugar control is eating a healthy diet by: o  Eating less sugar and carbohydrates o  Increasing activity/exercise o  Talking with your doctor about reaching your blood sugar goals . High blood sugars (greater than 180 mg/dL) can raise your risk of infections and slow your recovery, so you will need to focus on controlling your diabetes during the weeks before surgery. . Make sure that the doctor who takes care of your diabetes knows about your planned surgery including the date and location.  How do I manage my blood sugar before surgery? . Check your blood sugar at least 4 times a day, starting 2 days before surgery, to make sure that the level is not too high or low. o Check your blood sugar the morning of your surgery when you wake up and every 2 hours until you get to the Short Stay unit. . If your blood sugar is less than 70 mg/dL, you will need to treat for low blood sugar: o Do not take insulin. o Treat a low blood sugar (less than 70 mg/dL) with  cup of clear juice (cranberry or apple), 4 glucose tablets, OR glucose gel. o Recheck blood sugar in 15 minutes after treatment (to make  sure it is greater than 70 mg/dL). If your blood sugar is not greater than 70 mg/dL on recheck, call [phone] for further instructions. . Report your blood sugar to the short stay nurse when you get to Short Stay.  . If you are admitted to the hospital after surgery: o Your blood sugar will be checked by the staff and you will probably be given insulin after surgery (instead of oral diabetes medicines) to make sure you have good blood sugar levels. o The goal for blood sugar control after surgery is 80-180 mg/dL.   WHAT DO I DO ABOUT MY DIABETES MEDICATION?  Marland Kitchen Do not take oral diabetes medicines (pills) the morning of surgery.  . THE DAY BEFORE SURGERY:  Take your usual dose of Metformin.  However, take ONLY your scheduled Morning Glipizide.  Do not take Glipizide that night , even if it is prescribed for you to do so.           Reviewed and Endorsed by [facility] Patient Education Committee, August 2015       [facility] - Preparing for Surgery Before surgery, you can play an important role.  Because skin is not sterile, your skin needs to be as free of germs as possible.  You can reduce the number of germs on your skin by washing with CHG (chlorahexidine gluconate) soap before surgery.  CHG is an antiseptic cleaner which kills  germs and bonds with the skin to continue killing germs even after washing. Please DO NOT use if you have an allergy to CHG or antibacterial soaps.  If your skin becomes reddened/irritated stop using the CHG and inform your nurse when you arrive at Short Stay. Do not shave (including legs and underarms) for at least 48 hours prior to the first CHG shower.  You may shave your face/neck.  Please follow these instructions carefully:  1.  Shower with CHG Soap the night before surgery and the  morning of surgery.  2.  If you choose to wash your hair, wash your hair first as usual with your normal  shampoo.  3.  After you shampoo, rinse your hair and body  thoroughly to remove the shampoo.                             4.  Use CHG as you would any other liquid soap.  You can apply chg directly to the skin and wash.  Gently with a scrungie or clean washcloth.  5.  Apply the CHG Soap to your body ONLY FROM THE NECK DOWN.   Do   not use on face/ open                           Wound or open sores. Avoid contact with eyes, ears mouth and   genitals (private parts).                       Wash face,  Genitals (private parts) with your normal soap.             6.  Wash thoroughly, paying special attention to the area where your    surgery  will be performed.  7.  Thoroughly rinse your body with warm water from the neck down.  8.  DO NOT shower/wash with your normal soap after using and rinsing off the CHG Soap.                9.  Pat yourself dry with a clean towel.            10.  Wear clean pajamas.            11.  Place clean sheets on your bed the night of your first shower and do not  sleep with pets. Day of Surgery : Do not apply any lotions/deodorants the morning of surgery.  Please wear clean clothes to the hospital/surgery center.  FAILURE TO FOLLOW THESE INSTRUCTIONS MAY RESULT IN THE CANCELLATION OF YOUR SURGERY  PATIENT SIGNATURE_________________________________  NURSE SIGNATURE__________________________________     Incentive Spirometer  An incentive spirometer is a tool that can help keep your lungs clear and active. This tool measures how well you are filling your lungs with each breath. Taking long deep breaths may help reverse or decrease the chance of developing breathing (pulmonary) problems (especially infection) following:  A long period of time when you are unable to move or be active. BEFORE THE PROCEDURE   If the spirometer includes an indicator to show your best effort, your nurse or respiratory therapist will set it to a desired goal.  If possible, sit up straight or lean slightly forward. Try not to slouch.  Hold the  incentive spirometer in an upright position. INSTRUCTIONS FOR USE  1. Sit on the edge of your bed  if possible, or sit up as far as you can in bed or on a chair. 2. Hold the incentive spirometer in an upright position. 3. Breathe out normally. 4. Place the mouthpiece in your mouth and seal your lips tightly around it. 5. Breathe in slowly and as deeply as possible, raising the piston or the ball toward the top of the column. 6. Hold your breath for 3-5 seconds or for as long as possible. Allow the piston or ball to fall to the bottom of the column. 7. Remove the mouthpiece from your mouth and breathe out normally. 8. Rest for a few seconds and repeat Steps 1 through 7 at least 10 times every 1-2 hours when you are awake. Take your time and take a few normal breaths between deep breaths. 9. The spirometer may include an indicator to show your best effort. Use the indicator as a goal to work toward during each repetition. 10. After each set of 10 deep breaths, practice coughing to be sure your lungs are clear. If you have an incision (the cut made at the time of surgery), support your incision when coughing by placing a pillow or rolled up towels firmly against it. Once you are able to get out of bed, walk around indoors and cough well. You may stop using the incentive spirometer when instructed by your caregiver.  RISKS AND COMPLICATIONS  Take your time so you do not get dizzy or light-headed.  If you are in pain, you may need to take or ask for pain medication before doing incentive spirometry. It is harder to take a deep breath if you are having pain. AFTER USE  Rest and breathe slowly and easily.  It can be helpful to keep track of a log of your progress. Your caregiver can provide you with a simple table to help with this. If you are using the spirometer at home, follow these instructions: [place] IF:   You are having difficultly using the spirometer.  You have trouble using  the spirometer as often as instructed.  Your pain medication is not giving enough relief while using the spirometer.  You develop fever of 100.5 F (38.1 C) or higher. SEEK IMMEDIATE MEDICAL CARE IF:   You cough up bloody sputum that had not been present before.  You develop fever of 102 F (38.9 C) or greater.  You develop worsening pain at or near the incision site. MAKE SURE YOU:   Understand these instructions.  Will watch your condition.  Will get help right away if you are not doing well or get worse. Document Released: 02/12/2007 Document Revised: 12/25/2011 Document Reviewed: 04/15/2007 Exit[employer] Patient Information 2014 Exit[employer], [place].   ________________________________________________________________________  WHAT IS A BLOOD TRANSFUSION? Blood Transfusion Information  A transfusion is the replacement of blood or some of its parts. Blood is made up of multiple cells which provide different functions.  Red blood cells carry oxygen and are used for blood loss replacement.  White blood cells fight against infection.  Platelets control bleeding.  Plasma helps clot blood.  Other blood products are available for specialized needs, such as hemophilia or other clotting disorders. BEFORE THE TRANSFUSION  Who gives blood for transfusions?   Healthy volunteers who are fully evaluated to make sure their blood is safe. This is blood bank blood. Transfusion therapy is the safest it has ever been in the practice of medicine. Before blood is taken from a donor, a complete history is taken to make sure  that person has no history of diseases nor engages in risky social behavior (examples are intravenous drug use or sexual activity with multiple partners). The donor's travel history is screened to minimize risk of transmitting infections, such as malaria. The donated blood is tested for signs of infectious diseases, such as HIV and hepatitis. The blood is then tested to be sure it  is compatible with you in order to minimize the chance of a transfusion reaction. If you or a relative donates blood, this is often done in anticipation of surgery and is not appropriate for emergency situations. It takes many days to process the donated blood. RISKS AND COMPLICATIONS Although transfusion therapy is very safe and saves many lives, the main dangers of transfusion include:   Getting an infectious disease.  Developing a transfusion reaction. This is an allergic reaction to something in the blood you were given. Every precaution is taken to prevent this. The decision to have a blood transfusion has been considered carefully by your caregiver before blood is given. Blood is not given unless the benefits outweigh the risks. AFTER THE TRANSFUSION  Right after receiving a blood transfusion, you will usually feel much better and more energetic. This is especially true if your red blood cells have gotten low (anemic). The transfusion raises the level of the red blood cells which carry oxygen, and this usually causes an energy increase.  The nurse administering the transfusion will monitor you carefully for complications. HOME CARE INSTRUCTIONS  No special instructions are needed after a transfusion. You may find your energy is better. Speak with your caregiver about any limitations on activity for underlying diseases you may have. SEEK MEDICAL CARE IF:   Your condition is not improving after your transfusion.  You develop redness or irritation at the intravenous (IV) site. SEEK IMMEDIATE MEDICAL CARE IF:  Any of the following symptoms occur over the next 12 hours:  Shaking chills.  You have a temperature by mouth above 102 F (38.9 C), not controlled by medicine.  Chest, back, or muscle pain.  People around you feel you are not acting correctly or are confused.  Shortness of breath or difficulty breathing.  Dizziness and fainting.  You get a rash or develop hives.  You  have a decrease in urine output.  Your urine turns a dark color or changes to pink, red, or brown. Any of the following symptoms occur over the next 10 days:  You have a temperature by mouth above 102 F (38.9 C), not controlled by medicine.  Shortness of breath.  Weakness after normal activity.  The white part of the eye turns yellow (jaundice).  You have a decrease in the amount of urine or are urinating less often.  Your urine turns a dark color or changes to pink, red, or brown. Document Released: 09/29/2000 Document Revised: 12/25/2011 Document Reviewed: 05/18/2008 [facility] Patient Information 2014 [place], [place].  _______________________________________________________________________

## 2020-05-03 NOTE — Progress Notes (Addendum)
COVID Vaccine Completed: None Date COVID Vaccine completed: COVID vaccine manufacturer: [employer]   PCP - Gilford Rile, MD  Cardiologist -Glenetta Hew, MD.  Cecille Aver, PA w/cardiac clearance dated 04-22-20 in telephone encounter    No stimulator in back   Chest x-ray -  EKG - 03-09-20 Stress Test -  ECHO - 04-05-20 Cardiac Cath -   Sleep Study -  CPAP -   Fasting Blood Sugar - 99-`126 Checks Blood Sugar ___Daily__ times a day  Blood Thinner Instructions: Aspirin Instructions: Last Dose:  ADL's w/o SOB  Anesthesia review:   Patient denies shortness of breath, fever, cough and chest pain at PAT appointment   Patient verbalized understanding of instructions that were given to them at the PAT appointment. Patient was also instructed that they will need to review over the PAT instructions again at home before surgery.

## 2020-05-04 ENCOUNTER — Other Ambulatory Visit: Payer: Self-pay

## 2020-05-04 ENCOUNTER — Encounter (HOSPITAL_COMMUNITY): Payer: Self-pay

## 2020-05-04 ENCOUNTER — Encounter (HOSPITAL_COMMUNITY)
Admission: RE | Admit: 2020-05-04 | Discharge: 2020-05-04 | Disposition: A | Discharge status: Home / Self Care | Payer: Medicare Other | Source: Ambulatory Visit | Attending: Orthopedic Surgery | Admitting: Orthopedic Surgery

## 2020-05-04 DIAGNOSIS — M1711 Unilateral primary osteoarthritis, right knee: Secondary | ICD-10-CM | POA: Insufficient documentation

## 2020-05-04 DIAGNOSIS — Z01812 Encounter for preprocedural laboratory examination: Secondary | ICD-10-CM | POA: Diagnosis not present

## 2020-05-04 DIAGNOSIS — E119 Type 2 diabetes mellitus without complications: Secondary | ICD-10-CM | POA: Diagnosis not present

## 2020-05-04 DIAGNOSIS — Z79899 Other long term (current) drug therapy: Secondary | ICD-10-CM | POA: Diagnosis not present

## 2020-05-04 DIAGNOSIS — I110 Hypertensive heart disease with heart failure: Secondary | ICD-10-CM | POA: Insufficient documentation

## 2020-05-04 DIAGNOSIS — G4733 Obstructive sleep apnea (adult) (pediatric): Secondary | ICD-10-CM | POA: Insufficient documentation

## 2020-05-04 DIAGNOSIS — Z7984 Long term (current) use of oral hypoglycemic drugs: Secondary | ICD-10-CM | POA: Insufficient documentation

## 2020-05-04 DIAGNOSIS — I509 Heart failure, unspecified: Secondary | ICD-10-CM | POA: Insufficient documentation

## 2020-05-04 LAB — CBC
HCT: 37.9 % — ABNORMAL LOW (ref 39.0–52.0)
Hemoglobin: 12.4 g/dL — ABNORMAL LOW (ref 13.0–17.0)
MCH: 31.3 pg (ref 26.0–34.0)
MCHC: 32.7 g/dL (ref 30.0–36.0)
MCV: 95.7 fL (ref 80.0–100.0)
Platelets: 216 10*3/uL (ref 150–400)
RBC: 3.96 MIL/uL — ABNORMAL LOW (ref 4.22–5.81)
RDW: 13.7 % (ref 11.5–15.5)
WBC: 8.1 10*3/uL (ref 4.0–10.5)
nRBC: 0 % (ref 0.0–0.2)

## 2020-05-04 LAB — URINALYSIS, ROUTINE W REFLEX MICROSCOPIC
Bilirubin Urine: NEGATIVE
Glucose, UA: NEGATIVE mg/dL
Hgb urine dipstick: NEGATIVE
Ketones, ur: NEGATIVE mg/dL
Leukocytes,Ua: NEGATIVE
Nitrite: NEGATIVE
Protein, ur: NEGATIVE mg/dL
Specific Gravity, Urine: 1.016 (ref 1.005–1.030)
pH: 5 (ref 5.0–8.0)

## 2020-05-04 LAB — SURGICAL PCR SCREEN
MRSA, PCR: NEGATIVE
Staphylococcus aureus: NEGATIVE

## 2020-05-04 LAB — COMPREHENSIVE METABOLIC PANEL
ALT: 23 U/L (ref 0–44)
AST: 19 U/L (ref 15–41)
Albumin: 4 g/dL (ref 3.5–5.0)
Alkaline Phosphatase: 62 U/L (ref 38–126)
Anion gap: 12 (ref 5–15)
BUN: 28 mg/dL — ABNORMAL HIGH (ref 8–23)
CO2: 26 mmol/L (ref 22–32)
Calcium: 9.6 mg/dL (ref 8.9–10.3)
Chloride: 103 mmol/L (ref 98–111)
Creatinine, Ser: 1.23 mg/dL (ref 0.61–1.24)
GFR calc Af Amer: >60 mL/min (ref >60)
GFR calc non Af Amer: 57 mL/min — ABNORMAL LOW (ref >60)
Glucose, Bld: 181 mg/dL — ABNORMAL HIGH (ref 70–99)
Potassium: 4.4 mmol/L (ref 3.5–5.1)
Sodium: 141 mmol/L (ref 135–145)
Total Bilirubin: 0.3 mg/dL (ref 0.3–1.2)
Total Protein: 7 g/dL (ref 6.5–8.1)

## 2020-05-04 LAB — PROTIME-INR
INR: 1 (ref 0.8–1.2)
Prothrombin Time: 13.2 seconds (ref 11.4–15.2)

## 2020-05-04 LAB — GLUCOSE, CAPILLARY: Glucose-Capillary: 184 mg/dL — ABNORMAL HIGH (ref 70–99)

## 2020-05-04 LAB — HEMOGLOBIN A1C
Hgb A1c MFr Bld: 7.1 % — ABNORMAL HIGH (ref 4.8–5.6)
Mean Plasma Glucose: 157.07 mg/dL

## 2020-05-05 ENCOUNTER — Ambulatory Visit: Payer: Self-pay | Admitting: Student

## 2020-05-05 NOTE — H&P (Signed)
TOTAL KNEE ADMISSION H&P  Patient is being admitted for right total knee arthroplasty.  Subjective:  Chief Complaint:right knee pain.  HPI: Ethan Testa., 74 y.o. male, has a history of pain and functional disability in the right knee due to arthritis and has failed non-surgical conservative treatments for greater than 12 weeks to includeNSAID's and/or analgesics and activity modification.  Onset of symptoms was gradual, starting 4 years ago with gradually worsening course since that time. The patient noted no past surgery on the right knee(s).  Patient currently rates pain in the right knee(s) at 8 out of 10 with activity. Patient has worsening of pain with activity and weight bearing, pain that interferes with activities of daily living, pain with passive range of motion and joint swelling.  Patient has evidence of joint space narrowing by imaging studies. There is no active infection.  Patient Active Problem List   Diagnosis Date Noted  . Pre-operative cardiovascular examination 03/15/2020  . Nonspecific abnormal electrocardiogram (ECG) (EKG) 03/09/2020  . Obesity (BMI 30-39.9) 06/04/2013  . H/O dizziness 06/04/2013  . Diabetes mellitus type 2 in obese ([place])   . Low testosterone   . Essential hypertension   . Hypertriglyceridemia without hypercholesterolemia   . OSA on CPAP 09/24/2012   Past Medical History:  Diagnosis Date  . Acute respiratory failure due to COVID-19 ([place])   . CHF (congestive heart failure) ([place])   . Degenerative disc disease, lumbar   . Diabetes mellitus type 2 in obese ([place])   . Diverticulosis   . Erectile dysfunction   . High cholesterol   . Hypertension   . Hypertriglyceridemia without hypercholesterolemia    Last noted cholesterol the total showing 70, HDL 50, LDL unable to be calculated due to triglycerides of 10/18/1997  . Low testosterone    On replacement supplement  . Obesity (BMI 30-39.9)   . OSA on CPAP 09/24/2012   AHI of 78.1 --previously  followed by Dr. Claiborne Billings, last visit February 2014 -- marked improvement in daytime sleepiness. Able to sleep through the night  . Stomach problems   . Venous insufficiency of both lower extremities     Past Surgical History:  Procedure Laterality Date  . BACK SURGERY    . CARDIAC CATHETERIZATION  2003   Nonobstructive CAD  . CPAP titration  08/22/2012  . Polysomnogram  08/08/12   Diagnostic  . TRANSTHORACIC ECHOCARDIOGRAM  07/2012   Moderate concentric LVH, normal EF, grade 1 diastolic dysfunction and mild aortic sclerosis.    Current Outpatient Medications  Medication Sig Dispense Refill Last Dose  . citalopram (CELEXA) 40 MG tablet Take 40 mg by mouth daily.     Marland Kitchen doxazosin (CARDURA) 4 MG tablet Take 4 mg by mouth at bedtime.      . finasteride (PROSCAR) 5 MG tablet Take 5 mg by mouth daily.      Marland Kitchen glipiZIDE (GLUCOTROL) 5 MG tablet Take 5 mg by mouth 2 (two) times daily before a meal.     . Melatonin 10 MG TABS Take 10 mg by mouth daily.     . metFORMIN (GLUCOPHAGE) 500 MG tablet Take 500 mg by mouth 2 (two) times daily with a meal.     . Multiple Vitamins-Minerals (MULTIVITAMIN WITH MINERALS) tablet Take 1 tablet by mouth daily.     . NON FORMULARY C-PAP at bedtime     . tamsulosin (FLOMAX) 0.4 MG CAPS capsule Take 0.4 mg by mouth daily after supper.      Marland Kitchen  vitamin B-12 (CYANOCOBALAMIN) 1000 MCG tablet Take 2,000 mcg by mouth daily.       No current facility-administered medications for this visit.   Allergies  Allergen Reactions  . Lorazepam Other (See Comments)    Hallucination   . Morphine And Related Other (See Comments)    Hallucinations     Social History   Tobacco Use  . Smoking status: Never Smoker  . Smokeless tobacco: Never Used  Substance Use Topics  . Alcohol use: No    Family History  Problem Relation Age of Onset  . CAD Mother   . Diabetes Mellitus II Mother   . Hypertension Mother   . CAD Brother   . Diabetes Mellitus II Brother   . Hypertension  Brother      Review of Systems  Constitutional: Negative.   HENT: Negative.   Eyes: Negative.   Respiratory: Negative.   Cardiovascular: Positive for leg swelling.  Gastrointestinal: Negative.   Endocrine: Negative.   Genitourinary: Negative.   Musculoskeletal: Positive for arthralgias and gait problem.  Skin: Negative.   Allergic/Immunologic: Negative.   Hematological: Negative.   Psychiatric/Behavioral: Negative.     Objective:  Physical Exam Constitutional:      Appearance: Normal appearance.  HENT:     Head: Normocephalic and atraumatic.  Eyes:     Extraocular Movements: Extraocular movements intact.     Pupils: Pupils are equal, round, and reactive to light.  Cardiovascular:     Rate and Rhythm: Normal rate and regular rhythm.     Pulses: Normal pulses.     Heart sounds: Normal heart sounds.  Pulmonary:     Effort: Pulmonary effort is normal.     Breath sounds: Normal breath sounds.  Abdominal:     Palpations: Abdomen is soft.     Tenderness: There is no abdominal tenderness.  Genitourinary:    Comments: Deferred Musculoskeletal:     Cervical back: Normal range of motion and neck supple.     Comments: Examination of the right knee reveals no skin wounds or lesions. He has a very significant valgus deformity. Swelling and trace effusion. No warmth or erythema. He has global tenderness to palpation. His range of motion is 30-95. The knee is extremely stiff. He has painless range of motion of the hip.   Skin:    General: Skin is warm and dry.  Neurological:     Mental Status: He is alert and oriented to person, place, and time.  Psychiatric:        Mood and Affect: Mood normal.     Vital signs in last 24 hours: @VSRANGES @  Labs:   Estimated body mass index is 38.1 kg/m as calculated from the following:   Height as of 05/04/20: 5\' 9"  (1.753 m).   Weight as of 05/04/20: 117 kg.   Imaging Review Plain radiographs demonstrate severe degenerative joint  disease of the right knee(s). The overall alignment issignificant valgus. The bone quality appears to be adequate for age and reported activity level.      Assessment/Plan:  End stage arthritis, right knee   The patient history, physical examination, clinical judgment of the provider and imaging studies are consistent with end stage degenerative joint disease of the right knee(s) and total knee arthroplasty is deemed medically necessary. The treatment options including medical management, injection therapy arthroscopy and arthroplasty were discussed at length. The risks and benefits of total knee arthroplasty were presented and reviewed. The risks due to aseptic loosening, infection, stiffness, patella  tracking problems, thromboembolic complications and other imponderables were discussed. The patient acknowledged the explanation, agreed to proceed with the plan and consent was signed. Patient is being admitted for inpatient treatment for surgery, pain control, PT, OT, prophylactic antibiotics, VTE prophylaxis, progressive ambulation and ADL's and discharge planning. The patient is planning to be discharged home and he will complete outpatient physical therapy.     Patient's anticipated LOS is less than 2 midnights, meeting these requirements: - Lives within 1 hour of care - Has a competent adult at home to recover with post-op recover - NO history of  - Chronic pain requiring opiods  - Coronary Artery Disease  - Heart failure  - Heart attack  - Stroke  - DVT/VTE  - Cardiac arrhythmia  - Respiratory Failure/COPD  - Renal failure  - Anemia  - Advanced Liver disease

## 2020-05-05 NOTE — H&P (View-Only) (Signed)
TOTAL KNEE ADMISSION H&P  Patient is being admitted for right total knee arthroplasty.  Subjective:  Chief Complaint:right knee pain.  HPI: Ethan Goodman., 74 y.o. male, has a history of pain and functional disability in the right knee due to arthritis and has failed non-surgical conservative treatments for greater than 12 weeks to includeNSAID's and/or analgesics and activity modification.  Onset of symptoms was gradual, starting 4 years ago with gradually worsening course since that time. The patient noted no past surgery on the right knee(s).  Patient currently rates pain in the right knee(s) at 8 out of 10 with activity. Patient has worsening of pain with activity and weight bearing, pain that interferes with activities of daily living, pain with passive range of motion and joint swelling.  Patient has evidence of joint space narrowing by imaging studies. There is no active infection.  Patient Active Problem List   Diagnosis Date Noted  . Pre-operative cardiovascular examination 03/15/2020  . Nonspecific abnormal electrocardiogram (ECG) (EKG) 03/09/2020  . Obesity (BMI 30-39.9) 06/04/2013  . H/O dizziness 06/04/2013  . Diabetes mellitus type 2 in obese ([place])   . Low testosterone   . Essential hypertension   . Hypertriglyceridemia without hypercholesterolemia   . OSA on CPAP 09/24/2012   Past Medical History:  Diagnosis Date  . Acute respiratory failure due to COVID-19 ([place])   . CHF (congestive heart failure) ([place])   . Degenerative disc disease, lumbar   . Diabetes mellitus type 2 in obese ([place])   . Diverticulosis   . Erectile dysfunction   . High cholesterol   . Hypertension   . Hypertriglyceridemia without hypercholesterolemia    Last noted cholesterol the total showing 70, HDL 50, LDL unable to be calculated due to triglycerides of 10/18/1997  . Low testosterone    On replacement supplement  . Obesity (BMI 30-39.9)   . OSA on CPAP 09/24/2012   AHI of 78.1 --previously  followed by Dr. Claiborne Billings, last visit February 2014 -- marked improvement in daytime sleepiness. Able to sleep through the night  . Stomach problems   . Venous insufficiency of both lower extremities     Past Surgical History:  Procedure Laterality Date  . BACK SURGERY    . CARDIAC CATHETERIZATION  2003   Nonobstructive CAD  . CPAP titration  08/22/2012  . Polysomnogram  08/08/12   Diagnostic  . TRANSTHORACIC ECHOCARDIOGRAM  07/2012   Moderate concentric LVH, normal EF, grade 1 diastolic dysfunction and mild aortic sclerosis.    Current Outpatient Medications  Medication Sig Dispense Refill Last Dose  . citalopram (CELEXA) 40 MG tablet Take 40 mg by mouth daily.     Marland Kitchen doxazosin (CARDURA) 4 MG tablet Take 4 mg by mouth at bedtime.      . finasteride (PROSCAR) 5 MG tablet Take 5 mg by mouth daily.      Marland Kitchen glipiZIDE (GLUCOTROL) 5 MG tablet Take 5 mg by mouth 2 (two) times daily before a meal.     . Melatonin 10 MG TABS Take 10 mg by mouth daily.     . metFORMIN (GLUCOPHAGE) 500 MG tablet Take 500 mg by mouth 2 (two) times daily with a meal.     . Multiple Vitamins-Minerals (MULTIVITAMIN WITH MINERALS) tablet Take 1 tablet by mouth daily.     . NON FORMULARY C-PAP at bedtime     . tamsulosin (FLOMAX) 0.4 MG CAPS capsule Take 0.4 mg by mouth daily after supper.      Marland Kitchen  vitamin B-12 (CYANOCOBALAMIN) 1000 MCG tablet Take 2,000 mcg by mouth daily.       No current facility-administered medications for this visit.   Allergies  Allergen Reactions  . Lorazepam Other (See Comments)    Hallucination   . Morphine And Related Other (See Comments)    Hallucinations     Social History   Tobacco Use  . Smoking status: Never Smoker  . Smokeless tobacco: Never Used  Substance Use Topics  . Alcohol use: No    Family History  Problem Relation Age of Onset  . CAD Mother   . Diabetes Mellitus II Mother   . Hypertension Mother   . CAD Brother   . Diabetes Mellitus II Brother   . Hypertension  Brother      Review of Systems  Constitutional: Negative.   HENT: Negative.   Eyes: Negative.   Respiratory: Negative.   Cardiovascular: Positive for leg swelling.  Gastrointestinal: Negative.   Endocrine: Negative.   Genitourinary: Negative.   Musculoskeletal: Positive for arthralgias and gait problem.  Skin: Negative.   Allergic/Immunologic: Negative.   Hematological: Negative.   Psychiatric/Behavioral: Negative.     Objective:  Physical Exam Constitutional:      Appearance: Normal appearance.  HENT:     Head: Normocephalic and atraumatic.  Eyes:     Extraocular Movements: Extraocular movements intact.     Pupils: Pupils are equal, round, and reactive to light.  Cardiovascular:     Rate and Rhythm: Normal rate and regular rhythm.     Pulses: Normal pulses.     Heart sounds: Normal heart sounds.  Pulmonary:     Effort: Pulmonary effort is normal.     Breath sounds: Normal breath sounds.  Abdominal:     Palpations: Abdomen is soft.     Tenderness: There is no abdominal tenderness.  Genitourinary:    Comments: Deferred Musculoskeletal:     Cervical back: Normal range of motion and neck supple.     Comments: Examination of the right knee reveals no skin wounds or lesions. He has a very significant valgus deformity. Swelling and trace effusion. No warmth or erythema. He has global tenderness to palpation. His range of motion is 30-95. The knee is extremely stiff. He has painless range of motion of the hip.   Skin:    General: Skin is warm and dry.  Neurological:     Mental Status: He is alert and oriented to person, place, and time.  Psychiatric:        Mood and Affect: Mood normal.     Vital signs in last 24 hours: @VSRANGES @  Labs:   Estimated body mass index is 38.1 kg/m as calculated from the following:   Height as of 05/04/20: 5\' 9"  (1.753 m).   Weight as of 05/04/20: 117 kg.   Imaging Review Plain radiographs demonstrate severe degenerative joint  disease of the right knee(s). The overall alignment issignificant valgus. The bone quality appears to be adequate for age and reported activity level.      Assessment/Plan:  End stage arthritis, right knee   The patient history, physical examination, clinical judgment of the provider and imaging studies are consistent with end stage degenerative joint disease of the right knee(s) and total knee arthroplasty is deemed medically necessary. The treatment options including medical management, injection therapy arthroscopy and arthroplasty were discussed at length. The risks and benefits of total knee arthroplasty were presented and reviewed. The risks due to aseptic loosening, infection, stiffness, patella  tracking problems, thromboembolic complications and other imponderables were discussed. The patient acknowledged the explanation, agreed to proceed with the plan and consent was signed. Patient is being admitted for inpatient treatment for surgery, pain control, PT, OT, prophylactic antibiotics, VTE prophylaxis, progressive ambulation and ADL's and discharge planning. The patient is planning to be discharged home and he will complete outpatient physical therapy.     Patient's anticipated LOS is less than 2 midnights, meeting these requirements: - Lives within 1 hour of care - Has a competent adult at home to recover with post-op recover - NO history of  - Chronic pain requiring opiods  - Coronary Artery Disease  - Heart failure  - Heart attack  - Stroke  - DVT/VTE  - Cardiac arrhythmia  - Respiratory Failure/COPD  - Renal failure  - Anemia  - Advanced Liver disease

## 2020-05-07 NOTE — Progress Notes (Signed)
Anesthesia Chart Review   Case: [id number] Date/Time: 05/13/20 1224   Procedure: COMPUTER ASSISTED TOTAL KNEE ARTHROPLASTY (Right Knee)   Anesthesia type: Spinal   Pre-op diagnosis: Degenerative joint disease right knee   Location: [facility] ROOM 08 / [facility]   Surgeons: Rod Can, MD      DISCUSSION:74 y.o. never smoker with h/o HTN, DM II, OSA on CPAP, CHF, right knee DJD scheduled for above procedure 05/13/2020 with Dr. Rod Can.   Per cardiology preoperative risk assessment 04/23/2020, "Chart reviewed as part of pre-operative protocol coverage. Given past medical history and time since last visit, based on ACC/AHA guidelines, Ethan Goodman. would be at acceptable risk for the planned procedure without further cardiovascular testing.  I will route this recommendation to the requesting party via Epic fax function and remove from pre-op pool. Please call with questions.  Patient was seen recently and cleared by Dr. Ellyn Hack. Repeat echocardiogram showed normal EF without wall motion abnormality. Per Dr. Ellyn Hack, he is a low risk patient for the intended procedure."  Anticipate pt can proceed with planned procedure barring acute status change.   VS: BP (!) 164/77   Pulse 63   Temp 36.7 C (Oral)   Resp 16   Ht 5\' 9"  (1.753 m)   Wt 117 kg   SpO2 98%   BMI 38.10 kg/m   PROVIDERS: Raina Mina., MD is PCP   Glenetta Hew, MD is Cardiologist  LABS: Labs reviewed: Acceptable for surgery. (all labs ordered are listed, but only abnormal results are displayed)  Labs Reviewed  CBC - Abnormal; Notable for the following components:      Result Value   RBC 3.96 (*)    Hemoglobin 12.4 (*)    HCT 37.9 (*)    All other components within normal limits  COMPREHENSIVE METABOLIC PANEL - Abnormal; Notable for the following components:   Glucose, Bld 181 (*)    BUN 28 (*)    GFR calc non Af Amer 57 (*)    All other components within normal limits  HEMOGLOBIN A1C - Abnormal; Notable  for the following components:   Hgb A1c MFr Bld 7.1 (*)    All other components within normal limits  GLUCOSE, CAPILLARY - Abnormal; Notable for the following components:   Glucose-Capillary 184 (*)    All other components within normal limits  SURGICAL PCR SCREEN  PROTIME-INR  URINALYSIS, ROUTINE W REFLEX MICROSCOPIC  TYPE AND SCREEN     IMAGES:   EKG: 03/09/2020 Rate 68 bpm  NSR RBBB LAFB Bifascicular block   CV: Echo 04/05/20 IMPRESSIONS    1. Left ventricular ejection fraction, by estimation, is 55 to 60%. The  left ventricle has normal function. The left ventricle has no regional  wall motion abnormalities. There is moderate asymmetric left ventricular  hypertrophy of the septal segment.  Left ventricular diastolic parameters were normal.  2. Right ventricular systolic function is normal. The right ventricular  size is normal. Tricuspid regurgitation signal is inadequate for assessing  PA pressure.  3. The mitral valve is normal in structure. Trivial mitral valve  regurgitation. No evidence of mitral stenosis.  4. The aortic valve is grossly normal. Aortic valve regurgitation is not  visualized.  5. The inferior vena cava is dilated in size with >50% respiratory  variability, suggesting right atrial pressure of 8 mmHg. Past Medical History:  Diagnosis Date  . Acute respiratory failure due to COVID-19 ([place])   . CHF (congestive heart failure) ([place])   .  Degenerative disc disease, lumbar   . Diabetes mellitus type 2 in obese ([place])   . Diverticulosis   . Erectile dysfunction   . High cholesterol   . Hypertension   . Hypertriglyceridemia without hypercholesterolemia    Last noted cholesterol the total showing 70, HDL 50, LDL unable to be calculated due to triglycerides of 10/18/1997  . Low testosterone    On replacement supplement  . Obesity (BMI 30-39.9)   . OSA on CPAP 09/24/2012   AHI of 78.1 --previously followed by Dr. Claiborne Billings, last visit February 2014  -- marked improvement in daytime sleepiness. Able to sleep through the night  . Stomach problems   . Venous insufficiency of both lower extremities     Past Surgical History:  Procedure Laterality Date  . BACK SURGERY    . CARDIAC CATHETERIZATION  2003   Nonobstructive CAD  . CPAP titration  08/22/2012  . Polysomnogram  08/08/12   Diagnostic  . TRANSTHORACIC ECHOCARDIOGRAM  07/2012   Moderate concentric LVH, normal EF, grade 1 diastolic dysfunction and mild aortic sclerosis.    MEDICATIONS: . citalopram (CELEXA) 40 MG tablet  . doxazosin (CARDURA) 4 MG tablet  . finasteride (PROSCAR) 5 MG tablet  . glipiZIDE (GLUCOTROL) 5 MG tablet  . Melatonin 10 MG TABS  . metFORMIN (GLUCOPHAGE) 500 MG tablet  . Multiple Vitamins-Minerals (MULTIVITAMIN WITH MINERALS) tablet  . NON FORMULARY  . tamsulosin (FLOMAX) 0.4 MG CAPS capsule  . vitamin B-12 (CYANOCOBALAMIN) 1000 MCG tablet   No current facility-administered medications for this encounter.     Konrad Felix, PA-C [facility] [phone]

## 2020-05-07 NOTE — Anesthesia Preprocedure Evaluation (Addendum)
Anesthesia Evaluation  Patient identified by MRN, date of birth, ID band Patient awake    Reviewed: Allergy & Precautions, NPO status , Patient's Chart, lab work & pertinent test results  Airway Mallampati: III  TM Distance: >3 FB Neck ROM: Full    Dental no notable dental hx.    Pulmonary sleep apnea and Continuous Positive Airway Pressure Ventilation ,    Pulmonary exam normal breath sounds clear to auscultation       Cardiovascular hypertension, +CHF  Normal cardiovascular exam Rhythm:Regular Rate:Normal  ECG: NSR, rate 68  ECHO: 1. Left ventricular ejection fraction, by estimation, is 55 to 60%. The left ventricle has normal function. The left ventricle has no regional wall motion abnormalities. There is moderate asymmetric left ventricular hypertrophy of the septal segment. Left ventricular diastolic parameters were normal. 2. Right ventricular systolic function is normal. The right ventricular size is normal. Tricuspid regurgitation signal is inadequate for assessing PA pressure. 3. The mitral valve is normal in structure. Trivial mitral valve regurgitation. No evidence of mitral stenosis. 4. The aortic valve is grossly normal. Aortic valve regurgitation is not visualized. 5. The inferior vena cava is dilated in size with >50% respiratory variability, suggesting right atrial pressure of 8 mmHg.   Neuro/Psych Right foot drop negative psych ROS   GI/Hepatic negative GI ROS, Neg liver ROS,   Endo/Other  diabetes, Oral Hypoglycemic Agents  Renal/GU negative Renal ROS     Musculoskeletal  (+) Arthritis , Lumbar spine surgery x 2   Abdominal (+) + obese,   Peds  Hematology  (+) anemia ,   Anesthesia Other Findings Degenerative joint disease right knee  Reproductive/Obstetrics                          Anesthesia Physical Anesthesia Plan  ASA: III  Anesthesia Plan: Regional and Spinal    Post-op Pain Management:  Regional for Post-op pain   Induction: Intravenous  PONV Risk Score and Plan: 1 and Ondansetron, Dexamethasone, Propofol infusion and Treatment may vary due to age or medical condition  Airway Management Planned: Simple Face Mask  Additional Equipment:   Intra-op Plan:   Post-operative Plan:   Informed Consent: I have reviewed the patients History and Physical, chart, labs and discussed the procedure including the risks, benefits and alternatives for the proposed anesthesia with the patient or authorized representative who has indicated his/her understanding and acceptance.     Dental advisory given  Plan Discussed with: CRNA  Anesthesia Plan Comments: (Reviewed PAT note 05/04/2020, Konrad Felix, PA-C)      Anesthesia Quick Evaluation

## 2020-05-10 ENCOUNTER — Other Ambulatory Visit (HOSPITAL_COMMUNITY)
Admission: RE | Admit: 2020-05-10 | Discharge: 2020-05-10 | Disposition: A | Discharge status: Home / Self Care | Payer: Medicare Other | Source: Ambulatory Visit | Attending: Orthopedic Surgery | Admitting: Orthopedic Surgery

## 2020-05-10 DIAGNOSIS — Z20822 Contact with and (suspected) exposure to covid-19: Secondary | ICD-10-CM | POA: Diagnosis not present

## 2020-05-10 DIAGNOSIS — Z01812 Encounter for preprocedural laboratory examination: Secondary | ICD-10-CM | POA: Insufficient documentation

## 2020-05-10 LAB — SARS CORONAVIRUS 2 (TAT 6-24 HRS): SARS Coronavirus 2: NEGATIVE

## 2020-05-13 ENCOUNTER — Ambulatory Visit (HOSPITAL_COMMUNITY): Payer: Medicare Other

## 2020-05-13 ENCOUNTER — Other Ambulatory Visit: Payer: Self-pay

## 2020-05-13 ENCOUNTER — Encounter (HOSPITAL_COMMUNITY)
Admission: RE | Disposition: A | Discharge status: Home / Self Care | Payer: Self-pay | Source: Ambulatory Visit | Attending: Orthopedic Surgery

## 2020-05-13 ENCOUNTER — Ambulatory Visit (HOSPITAL_COMMUNITY): Payer: Medicare Other | Admitting: Certified Registered Nurse Anesthetist

## 2020-05-13 ENCOUNTER — Ambulatory Visit (HOSPITAL_COMMUNITY): Payer: Medicare Other | Admitting: Physician Assistant

## 2020-05-13 ENCOUNTER — Encounter (HOSPITAL_COMMUNITY): Payer: Self-pay | Admitting: Orthopedic Surgery

## 2020-05-13 ENCOUNTER — Ambulatory Visit (HOSPITAL_COMMUNITY)
Admission: RE | Admit: 2020-05-13 | Discharge: 2020-05-14 | Disposition: A | Discharge status: Home / Self Care | Payer: Medicare Other | Source: Ambulatory Visit | Attending: Orthopedic Surgery | Admitting: Orthopedic Surgery

## 2020-05-13 DIAGNOSIS — M21371 Foot drop, right foot: Secondary | ICD-10-CM | POA: Insufficient documentation

## 2020-05-13 DIAGNOSIS — D649 Anemia, unspecified: Secondary | ICD-10-CM | POA: Insufficient documentation

## 2020-05-13 DIAGNOSIS — R269 Unspecified abnormalities of gait and mobility: Secondary | ICD-10-CM | POA: Insufficient documentation

## 2020-05-13 DIAGNOSIS — G4733 Obstructive sleep apnea (adult) (pediatric): Secondary | ICD-10-CM | POA: Insufficient documentation

## 2020-05-13 DIAGNOSIS — E1151 Type 2 diabetes mellitus with diabetic peripheral angiopathy without gangrene: Secondary | ICD-10-CM | POA: Diagnosis not present

## 2020-05-13 DIAGNOSIS — Z888 Allergy status to other drugs, medicaments and biological substances status: Secondary | ICD-10-CM | POA: Diagnosis not present

## 2020-05-13 DIAGNOSIS — I509 Heart failure, unspecified: Secondary | ICD-10-CM | POA: Insufficient documentation

## 2020-05-13 DIAGNOSIS — E7800 Pure hypercholesterolemia, unspecified: Secondary | ICD-10-CM | POA: Diagnosis not present

## 2020-05-13 DIAGNOSIS — M1990 Unspecified osteoarthritis, unspecified site: Secondary | ICD-10-CM | POA: Insufficient documentation

## 2020-05-13 DIAGNOSIS — Z8616 Personal history of COVID-19: Secondary | ICD-10-CM | POA: Diagnosis not present

## 2020-05-13 DIAGNOSIS — Z833 Family history of diabetes mellitus: Secondary | ICD-10-CM | POA: Diagnosis not present

## 2020-05-13 DIAGNOSIS — Z885 Allergy status to narcotic agent status: Secondary | ICD-10-CM | POA: Insufficient documentation

## 2020-05-13 DIAGNOSIS — M1711 Unilateral primary osteoarthritis, right knee: Secondary | ICD-10-CM | POA: Diagnosis not present

## 2020-05-13 DIAGNOSIS — I110 Hypertensive heart disease with heart failure: Secondary | ICD-10-CM | POA: Diagnosis not present

## 2020-05-13 DIAGNOSIS — Z79899 Other long term (current) drug therapy: Secondary | ICD-10-CM | POA: Insufficient documentation

## 2020-05-13 DIAGNOSIS — I872 Venous insufficiency (chronic) (peripheral): Secondary | ICD-10-CM | POA: Insufficient documentation

## 2020-05-13 DIAGNOSIS — Z7984 Long term (current) use of oral hypoglycemic drugs: Secondary | ICD-10-CM | POA: Insufficient documentation

## 2020-05-13 DIAGNOSIS — E781 Pure hyperglyceridemia: Secondary | ICD-10-CM | POA: Insufficient documentation

## 2020-05-13 DIAGNOSIS — Z6838 Body mass index (BMI) 38.0-38.9, adult: Secondary | ICD-10-CM | POA: Diagnosis not present

## 2020-05-13 DIAGNOSIS — Z8249 Family history of ischemic heart disease and other diseases of the circulatory system: Secondary | ICD-10-CM | POA: Diagnosis not present

## 2020-05-13 DIAGNOSIS — E669 Obesity, unspecified: Secondary | ICD-10-CM | POA: Diagnosis not present

## 2020-05-13 HISTORY — PX: KNEE ARTHROPLASTY: SHX992

## 2020-05-13 LAB — POCT I-STAT, CHEM 8
BUN: 21 mg/dL (ref 8–23)
Calcium, Ion: 1.28 mmol/L (ref 1.15–1.40)
Chloride: 104 mmol/L (ref 98–111)
Creatinine, Ser: 1.1 mg/dL (ref 0.61–1.24)
Glucose, Bld: 107 mg/dL — ABNORMAL HIGH (ref 70–99)
HCT: 34 % — ABNORMAL LOW (ref 39.0–52.0)
Hemoglobin: 11.6 g/dL — ABNORMAL LOW (ref 13.0–17.0)
Potassium: 4.2 mmol/L (ref 3.5–5.1)
Sodium: 138 mmol/L (ref 135–145)
TCO2: 26 mmol/L (ref 22–32)

## 2020-05-13 LAB — GLUCOSE, CAPILLARY
Glucose-Capillary: 147 mg/dL — ABNORMAL HIGH (ref 70–99)
Glucose-Capillary: 156 mg/dL — ABNORMAL HIGH (ref 70–99)
Glucose-Capillary: 160 mg/dL — ABNORMAL HIGH (ref 70–99)

## 2020-05-13 LAB — PREPARE RBC (CROSSMATCH)

## 2020-05-13 LAB — ABO/RH: ABO/RH(D): O POS

## 2020-05-13 SURGERY — ARTHROPLASTY, KNEE, TOTAL, USING IMAGELESS COMPUTER-ASSISTED NAVIGATION
Anesthesia: Regional | Site: Knee | Laterality: Right

## 2020-05-13 MED ORDER — BUPIVACAINE-EPINEPHRINE 0.25% -1:200000 IJ SOLN
INTRAMUSCULAR | Status: DC | PRN
  Administered 2020-05-13: 30 mL

## 2020-05-13 MED ORDER — METHOCARBAMOL 500 MG PO TABS
500.0000 mg | ORAL_TABLET | Freq: Four times a day (QID) | ORAL | Status: DC | PRN
  Administered 2020-05-14: 500 mg via ORAL
  Filled 2020-05-13: qty 1

## 2020-05-13 MED ORDER — SODIUM CHLORIDE (PF) 0.9 % IJ SOLN
INTRAMUSCULAR | Status: DC | PRN
  Administered 2020-05-13: 30 mL

## 2020-05-13 MED ORDER — ACETAMINOPHEN 500 MG PO TABS: 1000.0000 mg | ORAL_TABLET | Freq: Once | ORAL | Status: DC

## 2020-05-13 MED ORDER — FENTANYL CITRATE (PF) 100 MCG/2ML IJ SOLN
INTRAMUSCULAR | Status: AC
  Administered 2020-05-13: 50 ug via INTRAVENOUS
  Filled 2020-05-13: qty 2

## 2020-05-13 MED ORDER — SODIUM CHLORIDE 0.9 % IV SOLN: INTRAVENOUS | Status: DC

## 2020-05-13 MED ORDER — ALUM & MAG HYDROXIDE-SIMETH 200-200-20 MG/5ML PO SUSP: 30.0000 mL | ORAL | Status: DC | PRN

## 2020-05-13 MED ORDER — SENNA 8.6 MG PO TABS
1.0000 | ORAL_TABLET | Freq: Two times a day (BID) | ORAL | Status: DC
  Administered 2020-05-13 – 2020-05-14 (×2): 8.6 mg via ORAL
  Filled 2020-05-13 (×2): qty 1

## 2020-05-13 MED ORDER — METHOCARBAMOL 500 MG IVPB - SIMPLE MED
500.0000 mg | Freq: Four times a day (QID) | INTRAVENOUS | Status: DC | PRN
  Filled 2020-05-13: qty 50

## 2020-05-13 MED ORDER — LACTATED RINGERS IV SOLN: INTRAVENOUS | Status: DC

## 2020-05-13 MED ORDER — DOXAZOSIN MESYLATE 4 MG PO TABS
4.0000 mg | ORAL_TABLET | Freq: Every day | ORAL | Status: DC
  Administered 2020-05-13: 4 mg via ORAL
  Filled 2020-05-13: qty 1

## 2020-05-13 MED ORDER — ONDANSETRON HCL 4 MG PO TABS
4.0000 mg | ORAL_TABLET | Freq: Four times a day (QID) | ORAL | Status: DC | PRN
  Filled 2020-05-13: qty 1

## 2020-05-13 MED ORDER — MELATONIN 5 MG PO TABS
10.0000 mg | ORAL_TABLET | Freq: Every day | ORAL | Status: DC
  Administered 2020-05-13 – 2020-05-14 (×2): 10 mg via ORAL
  Filled 2020-05-13 (×2): qty 2

## 2020-05-13 MED ORDER — PHENOL 1.4 % MT LIQD: 1.0000 | OROMUCOSAL | Status: DC | PRN

## 2020-05-13 MED ORDER — POVIDONE-IODINE 10 % EX SWAB
2.0000 "application " | Freq: Once | CUTANEOUS | Status: AC
  Administered 2020-05-13: 2 via TOPICAL

## 2020-05-13 MED ORDER — SODIUM CHLORIDE 0.9 % IR SOLN
Status: DC | PRN
  Administered 2020-05-13: 3000 mL

## 2020-05-13 MED ORDER — POVIDONE-IODINE 10 % EX SWAB: 2.0000 "application " | Freq: Once | CUTANEOUS | Status: DC

## 2020-05-13 MED ORDER — SODIUM CHLORIDE (PF) 0.9 % IJ SOLN
INTRAMUSCULAR | Status: AC
  Filled 2020-05-13: qty 50

## 2020-05-13 MED ORDER — SODIUM CHLORIDE 0.9 % IR SOLN
Status: DC | PRN
  Administered 2020-05-13: 1000 mL

## 2020-05-13 MED ORDER — PHENYLEPHRINE HCL-NACL 10-0.9 MG/250ML-% IV SOLN
INTRAVENOUS | Status: DC | PRN
Start: 2020-05-13 — End: 2020-05-13
  Administered 2020-05-13: 25 ug/min via INTRAVENOUS

## 2020-05-13 MED ORDER — FENTANYL CITRATE (PF) 100 MCG/2ML IJ SOLN
INTRAMUSCULAR | Status: AC
  Filled 2020-05-13: qty 2

## 2020-05-13 MED ORDER — CEFAZOLIN SODIUM-DEXTROSE 2-4 GM/100ML-% IV SOLN
INTRAVENOUS | Status: AC
  Filled 2020-05-13: qty 100

## 2020-05-13 MED ORDER — PROPOFOL 10 MG/ML IV BOLUS
INTRAVENOUS | Status: DC | PRN
  Administered 2020-05-13: 50 mg via INTRAVENOUS
  Administered 2020-05-13: 30 mg via INTRAVENOUS
  Administered 2020-05-13: 40 mg via INTRAVENOUS

## 2020-05-13 MED ORDER — DOCUSATE SODIUM 100 MG PO CAPS
100.0000 mg | ORAL_CAPSULE | Freq: Two times a day (BID) | ORAL | Status: DC
  Administered 2020-05-13 – 2020-05-14 (×2): 100 mg via ORAL
  Filled 2020-05-13 (×2): qty 1

## 2020-05-13 MED ORDER — TRANEXAMIC ACID-NACL 1000-0.7 MG/100ML-% IV SOLN
1000.0000 mg | INTRAVENOUS | Status: AC
  Administered 2020-05-13: 1000 mg via INTRAVENOUS
  Filled 2020-05-13: qty 100

## 2020-05-13 MED ORDER — 0.9 % SODIUM CHLORIDE (POUR BTL) OPTIME
TOPICAL | Status: DC | PRN
  Administered 2020-05-13: 1000 mL

## 2020-05-13 MED ORDER — ASPIRIN 81 MG PO CHEW
81.0000 mg | CHEWABLE_TABLET | Freq: Two times a day (BID) | ORAL | Status: DC
  Administered 2020-05-13 – 2020-05-14 (×2): 81 mg via ORAL
  Filled 2020-05-13 (×2): qty 1

## 2020-05-13 MED ORDER — ISOPROPYL ALCOHOL 70 % SOLN
Status: DC | PRN
  Administered 2020-05-13: 1 via TOPICAL

## 2020-05-13 MED ORDER — METOCLOPRAMIDE HCL 5 MG PO TABS
5.0000 mg | ORAL_TABLET | Freq: Three times a day (TID) | ORAL | Status: DC | PRN
  Filled 2020-05-13: qty 2

## 2020-05-13 MED ORDER — METHOCARBAMOL 500 MG IVPB - SIMPLE MED
INTRAVENOUS | Status: AC
  Administered 2020-05-13: 500 mg via INTRAVENOUS
  Filled 2020-05-13: qty 50

## 2020-05-13 MED ORDER — FENTANYL CITRATE (PF) 100 MCG/2ML IJ SOLN
50.0000 ug | INTRAMUSCULAR | Status: DC
  Filled 2020-05-13: qty 2

## 2020-05-13 MED ORDER — ONDANSETRON HCL 4 MG/2ML IJ SOLN: 4.0000 mg | Freq: Four times a day (QID) | INTRAMUSCULAR | Status: DC | PRN

## 2020-05-13 MED ORDER — CELECOXIB 200 MG PO CAPS
200.0000 mg | ORAL_CAPSULE | Freq: Two times a day (BID) | ORAL | Status: DC
  Administered 2020-05-13 – 2020-05-14 (×2): 200 mg via ORAL
  Filled 2020-05-13 (×2): qty 1

## 2020-05-13 MED ORDER — BUPIVACAINE HCL (PF) 0.5 % IJ SOLN
INTRAMUSCULAR | Status: DC | PRN
  Administered 2020-05-13: 3 mL via INTRATHECAL

## 2020-05-13 MED ORDER — DIPHENHYDRAMINE HCL 12.5 MG/5ML PO ELIX
12.5000 mg | ORAL_SOLUTION | ORAL | Status: DC | PRN
  Administered 2020-05-14: 25 mg via ORAL
  Filled 2020-05-13: qty 10

## 2020-05-13 MED ORDER — KETOROLAC TROMETHAMINE 30 MG/ML IJ SOLN
INTRAMUSCULAR | Status: AC
  Filled 2020-05-13: qty 1

## 2020-05-13 MED ORDER — MIDAZOLAM HCL 2 MG/2ML IJ SOLN
1.0000 mg | INTRAMUSCULAR | Status: DC
  Administered 2020-05-13: 2 mg via INTRAVENOUS
  Filled 2020-05-13: qty 2

## 2020-05-13 MED ORDER — METFORMIN HCL 500 MG PO TABS
500.0000 mg | ORAL_TABLET | Freq: Two times a day (BID) | ORAL | Status: DC
  Administered 2020-05-14: 500 mg via ORAL
  Filled 2020-05-13: qty 1

## 2020-05-13 MED ORDER — ACETAMINOPHEN 10 MG/ML IV SOLN
1000.0000 mg | Freq: Once | INTRAVENOUS | Status: AC
  Administered 2020-05-13: 1000 mg via INTRAVENOUS
  Filled 2020-05-13: qty 100

## 2020-05-13 MED ORDER — TAMSULOSIN HCL 0.4 MG PO CAPS
0.4000 mg | ORAL_CAPSULE | Freq: Every day | ORAL | Status: DC
  Administered 2020-05-13: 0.4 mg via ORAL
  Filled 2020-05-13: qty 1

## 2020-05-13 MED ORDER — ORAL CARE MOUTH RINSE: 15.0000 mL | Freq: Once | OROMUCOSAL | Status: AC

## 2020-05-13 MED ORDER — IRRISEPT - 450ML BOTTLE WITH 0.05% CHG IN STERILE WATER, USP 99.95% OPTIME
TOPICAL | Status: DC | PRN
  Administered 2020-05-13: 450 mL

## 2020-05-13 MED ORDER — FENTANYL CITRATE (PF) 100 MCG/2ML IJ SOLN
25.0000 ug | INTRAMUSCULAR | Status: DC | PRN
  Administered 2020-05-13 (×2): 25 ug via INTRAVENOUS
  Administered 2020-05-13: 50 ug via INTRAVENOUS

## 2020-05-13 MED ORDER — KETOROLAC TROMETHAMINE 30 MG/ML IJ SOLN
INTRAMUSCULAR | Status: DC | PRN
  Administered 2020-05-13: 30 mg via INTRA_ARTICULAR

## 2020-05-13 MED ORDER — ROPIVACAINE HCL 5 MG/ML IJ SOLN
INTRAMUSCULAR | Status: DC | PRN
  Administered 2020-05-13: 30 mL via PERINEURAL

## 2020-05-13 MED ORDER — ONDANSETRON HCL 4 MG/2ML IJ SOLN: 4.0000 mg | Freq: Once | INTRAMUSCULAR | Status: DC | PRN

## 2020-05-13 MED ORDER — MORPHINE SULFATE (PF) 4 MG/ML IV SOLN
0.5000 mg | INTRAVENOUS | Status: DC | PRN
  Administered 2020-05-14: 1 mg via INTRAVENOUS
  Filled 2020-05-13: qty 1

## 2020-05-13 MED ORDER — POLYETHYLENE GLYCOL 3350 17 G PO PACK: 17.0000 g | PACK | Freq: Every day | ORAL | Status: DC | PRN

## 2020-05-13 MED ORDER — ACETAMINOPHEN 325 MG PO TABS: 325.0000 mg | ORAL_TABLET | Freq: Four times a day (QID) | ORAL | Status: DC | PRN

## 2020-05-13 MED ORDER — FENTANYL CITRATE (PF) 100 MCG/2ML IJ SOLN
INTRAMUSCULAR | Status: DC | PRN
  Administered 2020-05-13 (×4): 25 ug via INTRAVENOUS

## 2020-05-13 MED ORDER — FINASTERIDE 5 MG PO TABS
5.0000 mg | ORAL_TABLET | Freq: Every day | ORAL | Status: DC
  Administered 2020-05-14: 5 mg via ORAL
  Filled 2020-05-13: qty 1

## 2020-05-13 MED ORDER — BUPIVACAINE-EPINEPHRINE 0.25% -1:200000 IJ SOLN
INTRAMUSCULAR | Status: AC
  Filled 2020-05-13: qty 1

## 2020-05-13 MED ORDER — METOCLOPRAMIDE HCL 5 MG/ML IJ SOLN: 5.0000 mg | Freq: Three times a day (TID) | INTRAMUSCULAR | Status: DC | PRN

## 2020-05-13 MED ORDER — CEFAZOLIN SODIUM-DEXTROSE 2-4 GM/100ML-% IV SOLN
2.0000 g | INTRAVENOUS | Status: AC
  Administered 2020-05-13 (×2): 2 g via INTRAVENOUS
  Filled 2020-05-13: qty 100

## 2020-05-13 MED ORDER — HYDROCODONE-ACETAMINOPHEN 7.5-325 MG PO TABS
1.0000 | ORAL_TABLET | ORAL | Status: DC | PRN
  Administered 2020-05-13 – 2020-05-14 (×3): 2 via ORAL
  Filled 2020-05-13 (×3): qty 2

## 2020-05-13 MED ORDER — PROPOFOL 500 MG/50ML IV EMUL
INTRAVENOUS | Status: DC | PRN
  Administered 2020-05-13: 75 ug/kg/min via INTRAVENOUS

## 2020-05-13 MED ORDER — GLIPIZIDE 5 MG PO TABS
5.0000 mg | ORAL_TABLET | Freq: Two times a day (BID) | ORAL | Status: DC
  Administered 2020-05-14: 5 mg via ORAL
  Filled 2020-05-13: qty 1

## 2020-05-13 MED ORDER — DEXAMETHASONE SODIUM PHOSPHATE 10 MG/ML IJ SOLN
10.0000 mg | Freq: Once | INTRAMUSCULAR | Status: AC
  Administered 2020-05-14: 10 mg via INTRAVENOUS
  Filled 2020-05-13: qty 1

## 2020-05-13 MED ORDER — ACETAMINOPHEN 10 MG/ML IV SOLN: 1000.0000 mg | Freq: Once | INTRAVENOUS | Status: DC | PRN

## 2020-05-13 MED ORDER — ALBUMIN HUMAN 5 % IV SOLN: INTRAVENOUS | Status: DC | PRN

## 2020-05-13 MED ORDER — HYDROCODONE-ACETAMINOPHEN 5-325 MG PO TABS: 1.0000 | ORAL_TABLET | ORAL | Status: DC | PRN

## 2020-05-13 MED ORDER — CHLORHEXIDINE GLUCONATE 0.12 % MT SOLN
15.0000 mL | Freq: Once | OROMUCOSAL | Status: AC
  Administered 2020-05-13: 15 mL via OROMUCOSAL

## 2020-05-13 MED ORDER — INSULIN ASPART 100 UNIT/ML ~~LOC~~ SOLN: 0.0000 [IU] | Freq: Three times a day (TID) | SUBCUTANEOUS | Status: DC

## 2020-05-13 MED ORDER — INSULIN ASPART 100 UNIT/ML ~~LOC~~ SOLN: 0.0000 [IU] | Freq: Every day | SUBCUTANEOUS | Status: DC

## 2020-05-13 MED ORDER — MENTHOL 3 MG MT LOZG: 1.0000 | LOZENGE | OROMUCOSAL | Status: DC | PRN

## 2020-05-13 MED ORDER — CEFAZOLIN SODIUM-DEXTROSE 2-4 GM/100ML-% IV SOLN
2.0000 g | Freq: Four times a day (QID) | INTRAVENOUS | Status: AC
  Administered 2020-05-13: 2 g via INTRAVENOUS
  Filled 2020-05-13: qty 100

## 2020-05-13 MED ORDER — ONDANSETRON HCL 4 MG/2ML IJ SOLN
INTRAMUSCULAR | Status: DC | PRN
  Administered 2020-05-13: 4 mg via INTRAVENOUS

## 2020-05-13 MED ORDER — STERILE WATER FOR IRRIGATION IR SOLN
Status: DC | PRN
  Administered 2020-05-13: 2000 mL

## 2020-05-13 MED ORDER — ADULT MULTIVITAMIN W/MINERALS CH
1.0000 | ORAL_TABLET | Freq: Every day | ORAL | Status: DC
  Administered 2020-05-13 – 2020-05-14 (×2): 1 via ORAL
  Filled 2020-05-13 (×3): qty 1

## 2020-05-13 MED ORDER — CITALOPRAM HYDROBROMIDE 20 MG PO TABS
40.0000 mg | ORAL_TABLET | Freq: Every day | ORAL | Status: DC
  Administered 2020-05-13: 40 mg via ORAL
  Filled 2020-05-13 (×2): qty 2

## 2020-05-13 SURGICAL SUPPLY — 75 items
APL PRP STRL LF DISP 70% ISPRP (MISCELLANEOUS) ×2
BAG SPEC THK2 15X12 ZIP CLS (MISCELLANEOUS)
BAG ZIPLOCK 12X15 (MISCELLANEOUS) IMPLANT
BASEPLATE TIBIAL UNV SZ7 (Knees) ×3 IMPLANT
BATTERY INSTRU NAVIGATION (MISCELLANEOUS) ×9 IMPLANT
BLADE SAW RECIPROCATING 77.5 (BLADE) ×3 IMPLANT
BLADE SURG 15 STRL LF DISP TIS (BLADE) ×1 IMPLANT
BLADE SURG 15 STRL SS (BLADE) ×3
BNDG ELASTIC 4X5.8 VLCR STR LF (GAUZE/BANDAGES/DRESSINGS) ×3 IMPLANT
BNDG ELASTIC 6X5.8 VLCR STR LF (GAUZE/BANDAGES/DRESSINGS) ×6 IMPLANT
BONE CEMENT SIMPLEX TOBRAMYCIN (Cement) ×9 IMPLANT
BSPLAT TIB 7 UNV CMNT TL STAB (Knees) ×1 IMPLANT
CEMENT BONE SIMPLEX TOBRAMYCIN (Cement) ×3 IMPLANT
CHLORAPREP W/TINT 26 (MISCELLANEOUS) ×6 IMPLANT
COMP PATELLA TRIATHLON 40X11 (Stem) ×3 IMPLANT
COMPONENT PTLLA TRTHLON 40X11 (Stem) ×1 IMPLANT
COMPONENT TRI CR RETAIN SZ6 RT (Miscellaneous) ×1 IMPLANT
COVER SURGICAL LIGHT HANDLE (MISCELLANEOUS) ×3 IMPLANT
COVER WAND RF STERILE (DRAPES) IMPLANT
CUFF TOURN SGL QUICK 34 (TOURNIQUET CUFF) ×3
CUFF TRNQT CYL 34X4.125X (TOURNIQUET CUFF) ×1 IMPLANT
DECANTER SPIKE VIAL GLASS SM (MISCELLANEOUS) ×6 IMPLANT
DERMABOND ADVANCED (GAUZE/BANDAGES/DRESSINGS) ×4
DERMABOND ADVANCED .7 DNX12 (GAUZE/BANDAGES/DRESSINGS) ×2 IMPLANT
DRAPE SHEET LG 3/4 BI-LAMINATE (DRAPES) ×9 IMPLANT
DRAPE U-SHAPE 47X51 STRL (DRAPES) ×3 IMPLANT
DRSG AQUACEL AG ADV 3.5X10 (GAUZE/BANDAGES/DRESSINGS) ×3 IMPLANT
DRSG TEGADERM 4X4.75 (GAUZE/BANDAGES/DRESSINGS) IMPLANT
ELECT BLADE TIP CTD 4 INCH (ELECTRODE) ×3 IMPLANT
ELECT REM PT RETURN 15FT ADLT (MISCELLANEOUS) ×3 IMPLANT
EVACUATOR 1/8 PVC DRAIN (DRAIN) IMPLANT
GAUZE SPONGE 4X4 12PLY STRL (GAUZE/BANDAGES/DRESSINGS) ×3 IMPLANT
GLOVE BIO SURGEON STRL SZ8.5 (GLOVE) ×6 IMPLANT
GLOVE BIOGEL PI IND STRL 8.5 (GLOVE) ×1 IMPLANT
GLOVE BIOGEL PI INDICATOR 8.5 (GLOVE) ×2
GOWN SPEC L3 XXLG W/TWL (GOWN DISPOSABLE) ×3 IMPLANT
HANDPIECE INTERPULSE COAX TIP (DISPOSABLE) ×3
HOLDER FOLEY CATH W/STRAP (MISCELLANEOUS) ×3 IMPLANT
HOOD PEEL AWAY FLYTE STAYCOOL (MISCELLANEOUS) ×9 IMPLANT
INSERT TIBIAL SZ7 CS 11 (Miscellaneous) ×3 IMPLANT
JET LAVAGE IRRISEPT WOUND (IRRIGATION / IRRIGATOR) ×3
KIT TURNOVER KIT A (KITS) IMPLANT
LAVAGE JET IRRISEPT WOUND (IRRIGATION / IRRIGATOR) ×1 IMPLANT
MARKER SKIN DUAL TIP RULER LAB (MISCELLANEOUS) ×3 IMPLANT
NDL SAFETY ECLIPSE 18X1.5 (NEEDLE) ×2 IMPLANT
NEEDLE HYPO 18GX1.5 SHARP (NEEDLE) ×6
NEEDLE SPNL 18GX3.5 QUINCKE PK (NEEDLE) ×3 IMPLANT
NS IRRIG 1000ML POUR BTL (IV SOLUTION) ×3 IMPLANT
PACK TOTAL KNEE CUSTOM (KITS) ×3 IMPLANT
PADDING CAST COTTON 6X4 STRL (CAST SUPPLIES) ×12 IMPLANT
PENCIL SMOKE EVACUATOR (MISCELLANEOUS) ×3 IMPLANT
PROTECTOR NERVE ULNAR (MISCELLANEOUS) ×3 IMPLANT
SAW OSC TIP CART 19.5X105X1.3 (SAW) ×6 IMPLANT
SEALER BIPOLAR AQUA 6.0 (INSTRUMENTS) ×3 IMPLANT
SET HNDPC FAN SPRY TIP SCT (DISPOSABLE) ×1 IMPLANT
SET PAD KNEE POSITIONER (MISCELLANEOUS) ×3 IMPLANT
SPONGE DRAIN TRACH 4X4 STRL 2S (GAUZE/BANDAGES/DRESSINGS) IMPLANT
STEM CEMENTED TRIATHLON (Stem) ×3 IMPLANT
STEM EXTENDER 25MM (Stem) ×3 IMPLANT
SUT MNCRL AB 3-0 PS2 18 (SUTURE) ×3 IMPLANT
SUT MNCRL AB 4-0 PS2 18 (SUTURE) ×3 IMPLANT
SUT MON AB 2-0 CT1 36 (SUTURE) ×3 IMPLANT
SUT STRATAFIX PDO 1 14 VIOLET (SUTURE) ×3
SUT STRATFX PDO 1 14 VIOLET (SUTURE) ×1
SUT VIC AB 1 CTX 36 (SUTURE) ×6
SUT VIC AB 1 CTX36XBRD ANBCTR (SUTURE) ×2 IMPLANT
SUT VIC AB 2-0 CT1 27 (SUTURE) ×3
SUT VIC AB 2-0 CT1 TAPERPNT 27 (SUTURE) ×1 IMPLANT
SUTURE STRATFX PDO 1 14 VIOLET (SUTURE) ×1 IMPLANT
SYR 3ML LL SCALE MARK (SYRINGE) ×6 IMPLANT
TOWER CARTRIDGE SMART MIX (DISPOSABLE) ×3 IMPLANT
TRAY FOLEY MTR SLVR 16FR STAT (SET/KITS/TRAYS/PACK) ×3 IMPLANT
TRIATHLON CRUCIATE RETAIN SZ6 (Miscellaneous) ×3 IMPLANT
WATER STERILE IRR 1000ML POUR (IV SOLUTION) ×6 IMPLANT
WRAP KNEE MAXI GEL POST OP (GAUZE/BANDAGES/DRESSINGS) ×3 IMPLANT

## 2020-05-13 NOTE — Interval H&P Note (Signed)
History and Physical Interval Note:  05/13/2020 12:16 PM  Ethan Goodman.  has presented today for surgery, with the diagnosis of Degenerative joint disease right knee.  The various methods of treatment have been discussed with the patient and family. After consideration of risks, benefits and other options for treatment, the patient has consented to  Procedure(s): COMPUTER ASSISTED TOTAL KNEE ARTHROPLASTY (Right) as a surgical intervention.  The patient's history has been reviewed, patient examined, no change in status, stable for surgery.  I have reviewed the patient's chart and labs.  Questions were answered to the patient's satisfaction.     Hilton Cork Jesiel Garate

## 2020-05-13 NOTE — Anesthesia Postprocedure Evaluation (Signed)
Anesthesia Post Note  Patient: Ethan Goodman.  Procedure(s) Performed: COMPUTER ASSISTED TOTAL KNEE ARTHROPLASTY (Right Knee)     Patient location during evaluation: PACU Anesthesia Type: Combined General/Spinal Level of consciousness: awake and alert Pain management: pain level controlled Vital Signs Assessment: post-procedure vital signs reviewed and stable Respiratory status: spontaneous breathing, nonlabored ventilation, respiratory function stable and patient connected to nasal cannula oxygen Cardiovascular status: blood pressure returned to baseline and stable Postop Assessment: no apparent nausea or vomiting, patient able to bend at knees, spinal receding, no headache and no backache Anesthetic complications: no   No complications documented.  Last Vitals:  Vitals:   05/13/20 1810 05/13/20 1815  BP: (!) 174/93 (!) 164/101  Pulse: 61   Resp: 13 14  Temp: 36.7 C   SpO2: 100%     Last Pain:  Vitals:   05/13/20 1815  TempSrc:   PainSc: [place]

## 2020-05-13 NOTE — Transfer of Care (Signed)
Immediate Anesthesia Transfer of Care Note  Patient: Ethan Goodman.  Procedure(s) Performed: COMPUTER ASSISTED TOTAL KNEE ARTHROPLASTY (Right Knee)  Patient Location: PACU  Anesthesia Type:Spinal  Level of Consciousness: awake and alert   Airway & Oxygen Therapy: Patient Spontanous Breathing and Patient connected to face mask oxygen  Post-op Assessment: Good AW stable VS O 2 inatct Post vital signs: Reviewed and stable  Last Vitals:  Vitals Value Taken Time  BP 174/93 05/13/20 1810  Temp    Pulse 62 05/13/20 1811  Resp 16 05/13/20 1811  SpO2 100 % 05/13/20 1811  Vitals shown include unvalidated device data.  Last Pain:  Vitals:   05/13/20 1110  TempSrc:   PainSc: 0-No pain         Complications: No complications documented.

## 2020-05-13 NOTE — Anesthesia Procedure Notes (Signed)
Anesthesia Regional Block: Adductor canal block   Pre-Anesthetic Checklist: ,, timeout performed, Correct Patient, Correct Site, Correct Laterality, Correct Procedure,, site marked, risks and benefits discussed, Surgical consent,  Pre-op evaluation,  At surgeon's request and post-op pain management  Laterality: Right  Prep: chloraprep       Needles:  Injection technique: Single-shot  Needle Type: Echogenic Stimulator Needle     Needle Length: 10cm  Needle Gauge: 20     Additional Needles:   Procedures:,,,, ultrasound used (permanent image in chart),,,,  Narrative:  Start time: 05/13/2020 11:50 AM End time: 05/13/2020 12:00 PM Injection made incrementally with aspirations every 5 mL.  Performed by: Personally  Anesthesiologist: Murvin Natal, MD  Additional Notes: Functioning IV was confirmed and monitors were applied. A time-out was performed. Hand hygiene and sterile gloves were used. The thigh was placed in a frog-leg position and prepped in a sterile fashion. A 164mm 20ga BBraun echogenic stimulator needle was placed using ultrasound guidance.  Negative aspiration and negative test dose prior to incremental administration of local anesthetic. The patient tolerated the procedure well.

## 2020-05-13 NOTE — Op Note (Signed)
OPERATIVE REPORT  SURGEON: Rod Can, MD   ASSISTANT: Cherlynn June, PA-C Nehemiah Massed, PA-C  PREOPERATIVE DIAGNOSIS: Right knee arthritis.   POSTOPERATIVE DIAGNOSIS: Right knee arthritis.   PROCEDURE: Complex Right total knee arthroplasty.   IMPLANTS: Stryker Triathlon CR femur, size 7. Stryker universal tibia, size 7, 25 mm, 12 x 50 mm. X3 polyethelyene insert, size 11 mm, CS. 3 button asymmetric patella, size 40 mm. Simplex P bone cement.  ANESTHESIA:  MAC, Regional and Spinal  TOURNIQUET TIME: 22 min at 373m Hg.  ESTIMATED BLOOD LOSS: -700 mL  ANTIBIOTICS: 2 g Ancef.  DRAINS: Medium HV x1.  COMPLICATIONS: None   CONDITION: PACU - hemodynamically stable.   BRIEF CLINICAL NOTE: Ethan Goodman is a 74y.o. male with a long-standing history of Right knee arthritis. After failing conservative management, the patient was indicated for total knee arthroplasty. The risks, benefits, and alternatives to the procedure were explained, and the patient elected to proceed.  PROCEDURE IN DETAIL: Spinal anesthesia was obtained in the pre-op holding area. Once inside the operative room, a foley catheter was inserted. The patient was then positioned, a nonsterile tourniquet was placed, and the lower extremity was prepped and draped in the normal sterile surgical fashion. A time-out was called verifying side and site of surgery. The patient received IV antibiotics within 60 minutes of beginning the procedure.   An anterior approach to the knee was performed utilizing a medial parapatellar arthrotomy. A medial release was performed and the patellar fat pad was excised. Stryker navigation was used to cut the distal femur perpendicular to the mechanical axis. A freehand patellar resection was performed, and the patella was sized an prepared with 3 lug holes.  Nagivation was used to  make a neutral proximal tibia resection, taking 6 mm of bone from the less affected lateral side with 3 degrees of slope. The menisci were excised. A spacer block was placed, and the alignment and balance in extension were confirmed.   The distal femur was sized using the 3-degree external rotation guide referencing the posterior femoral cortex. The appropriate 4-in-1 cutting block was pinned into place. Rotation was checked using Whiteside's line, the epicondylar axis, and then confirmed with a spacer block in flexion. The remaining femoral cuts were performed, taking care to protect the MCL.  The tibia was sized and the trial tray was pinned into place. The remaining trail components were inserted. The knee was stable to varus and valgus stress through a full range of motion. The patella tracked centrally, and the PCL was well balanced. The trial components were removed, and the proximal tibial surface was prepared. The extremity was exsanguinated with an Esmarch, and the tourniquet was inflated. Small drill holes were made in the sclerotic subchondral bone.The cut bony surfaces were irrigated with pulse lavage. Final tibial component was cemented into place and excess cement was cleared. Final femoral component was press fit into place, and the trial insert was placed, and the knee was brought into extension while the cement polymerized. The patellar component was press fit into place. Once the cement was hard, the knee was tested for a final time and found to be well balanced. The trial insert was exchanged for the real polyethylene insert.  The wound was copiously irrigated with Irrisept solution and normal saline with pulse lavage. Marcaine solution was injected into the periarticular soft tissue. The wound was closed in layers using #1 Vicryl and V-Loc for the fascia, 2-0 Vicryl for the subcutaneous fat,  2-0 Monocryl for the deep dermal layer, 3-0 running Monocryl subcuticular Stitch, and Dermabond  for the skin. Once the glue was fully dried, an Aquacell Ag and compressive dressing were applied. The tourniquet was let down, and the patient was transported to the recovery room in stable ondition. Sponge, needle, and instrument counts were correct at the end of the case x2. The patient tolerated the procedure well and there were no known complications.  Please note that a surgical assistant was a medical necessity for this procedure in order to perform it in a safe and expeditious manner. Surgical assistant was necessary to retract the ligaments and vital neurovascular structures to prevent injury to them and also necessary for proper positioning of the limb to allow for anatomic placement of the prosthesis.

## 2020-05-13 NOTE — Discharge Instructions (Signed)
° °Dr. Tukker Byrns °Total Joint Specialist °[facility] °[address] °[place], [place] [address] °[phone] ° °TOTAL KNEE REPLACEMENT POSTOPERATIVE DIRECTIONS ° ° ° °Knee Rehabilitation, Guidelines Following Surgery  °Results after knee surgery are often greatly improved when you follow the exercise, range of motion and muscle strengthening exercises prescribed by your doctor. Safety measures are also important to protect the knee from further injury. Any time any of these exercises cause you to have increased pain or swelling in your knee joint, decrease the amount until you are comfortable again and slowly increase them. If you have problems or questions, call your caregiver or physical therapist for advice.  ° °WEIGHT BEARING °Weight bearing as tolerated with assist device (walker, cane, etc) as directed, use it as long as suggested by your surgeon or therapist, typically at least 4-6 weeks. ° °HOME CARE INSTRUCTIONS  °Remove items at home which could result in a fall. This includes throw rugs or furniture in walking pathways.  °Continue medications as instructed at time of discharge. °You may have some home medications which will be placed on hold until you complete the course of blood thinner medication.  °You may start showering once you are discharged home but do not submerge the incision under water. Just pat the incision dry and apply a dry gauze dressing on daily. °Walk with walker as instructed.  °You may resume a sexual relationship in one month or when given the OK by your doctor.  °· Use walker as long as suggested by your caregivers. °· Avoid periods of inactivity such as sitting longer than an hour when not asleep. This helps prevent blood clots.  °You may put full weight on your legs and walk as much as is comfortable.  °You may return to work once you are cleared by your doctor.  °Do not drive a car for 6 weeks or until released by you surgeon.  °· Do not drive  while taking narcotics.  °Wear the elastic stockings for three weeks following surgery during the day but you may remove then at night. °Make sure you keep all of your appointments after your operation with all of your doctors and caregivers. You should call the office at the above phone number and make an appointment for approximately two weeks after the date of your surgery. °Do not remove your surgical dressing. The dressing is waterproof; you may take showers in 3 days, but do not take tub baths or submerge the dressing. °Please pick up a stool softener and laxative for home use as long as you are requiring pain medications. °· ICE to the affected knee every three hours for 30 minutes at a time and then as needed for pain and swelling.  Continue to use ice on the knee for pain and swelling from surgery. You may notice swelling that will progress down to the foot and ankle.  This is normal after surgery.  Elevate the leg when you are not up walking on it.   °It is important for you to complete the blood thinner medication as prescribed by your doctor. °· Continue to use the breathing machine which will help keep your temperature down.  It is common for your temperature to cycle up and down following surgery, especially at night when you are not up moving around and exerting yourself.  The breathing machine keeps your lungs expanded and your temperature down. ° °RANGE OF MOTION AND STRENGTHENING EXERCISES  °Rehabilitation of the knee is important following   a knee injury or an operation. After just a few days of immobilization, the muscles of the thigh which control the knee become weakened and shrink (atrophy). Knee exercises are designed to build up the tone and strength of the thigh muscles and to improve knee motion. Often times heat used for twenty to thirty minutes before working out will loosen up your tissues and help with improving the range of motion but do not use heat for the first two weeks following  surgery. These exercises can be done on a training (exercise) mat, on the floor, on a table or on a bed. Use what ever works the best and is most comfortable for you Knee exercises include:  °Leg Lifts - While your knee is still immobilized in a splint or cast, you can do straight leg raises. Lift the leg to 60 degrees, hold for 3 sec, and slowly lower the leg. Repeat 10-20 times 2-3 times daily. Perform this exercise against resistance later as your knee gets better.  °Quad and Hamstring Sets - Tighten up the muscle on the front of the thigh (Quad) and hold for 5-10 sec. Repeat this 10-20 times hourly. Hamstring sets are done by pushing the foot backward against an object and holding for 5-10 sec. Repeat as with quad sets.  °A rehabilitation program following serious knee injuries can speed recovery and prevent re-injury in the future due to weakened muscles. Contact your doctor or a physical therapist for more information on knee rehabilitation.  ° °SKILLED REHAB INSTRUCTIONS: °If the patient is transferred to a skilled rehab facility following release from the hospital, a list of the current medications will be sent to the facility for the patient to continue.  When discharged from the skilled rehab facility, please have the facility set up the patient's Home Health Physical Therapy prior to being released. Also, the skilled facility will be responsible for providing the patient with their medications at time of release from the facility to include their pain medication, the muscle relaxants, and their blood thinner medication. If the patient is still at the rehab facility at time of the two week follow up appointment, the skilled rehab facility will also need to assist the patient in arranging follow up appointment in our office and any transportation needs. ° °MAKE SURE YOU:  °Understand these instructions.  °Will watch your condition.  °Will get help right away if you are not doing well or get worse.   ° ° °Pick up stool softner and laxative for home use following surgery while on pain medications. °Do NOT remove your dressing. You may shower.  °Do not take tub baths or submerge incision under water. °May shower starting three days after surgery. °Please use a clean towel to pat the incision dry following showers. °Continue to use ice for pain and swelling after surgery. °Do not use any lotions or creams on the incision until instructed by your surgeon. ° °

## 2020-05-13 NOTE — Progress Notes (Signed)
AssistedDr. Ellender with right, ultrasound guided, adductor canal block. Side rails up, monitors on throughout procedure. See vital signs in flow sheet. Tolerated Procedure well.  

## 2020-05-13 NOTE — Anesthesia Procedure Notes (Signed)
Procedure Name: LMA Insertion Date/Time: 05/13/2020 2:46 PM Performed by: Cynda Familia, CRNA Pre-anesthesia Checklist: Patient identified, Emergency Drugs available, Suction available and Patient being monitored Patient Re-evaluated:Patient Re-evaluated prior to induction Oxygen Delivery Method: Circle System Utilized Preoxygenation: Pre-oxygenation with 100% oxygen Induction Type: IV induction Ventilation: Mask ventilation without difficulty LMA: LMA inserted and LMA with gastric port inserted LMA Size: 4.0 Number of attempts: 1 Placement Confirmation: positive ETCO2 Tube secured with: Tape Dental Injury: Teeth and Oropharynx as per pre-operative assessment  Comments: IV induction Turk-- LMA inserted AM CRNA atraumatic-- teeth and mouth as preop -- bilat BS

## 2020-05-13 NOTE — Anesthesia Procedure Notes (Signed)
Date/Time: 05/13/2020 5:53 PM Performed by: Cynda Familia, CRNA Oxygen Delivery Method: Simple face mask Placement Confirmation: positive ETCO2 and breath sounds checked- equal and bilateral Dental Injury: Teeth and Oropharynx as per pre-operative assessment

## 2020-05-13 NOTE — Anesthesia Procedure Notes (Signed)
Spinal  Patient location during procedure: OR Start time: 05/13/2020 1:25 PM End time: 05/13/2020 1:35 PM Staffing Performed: anesthesiologist  Anesthesiologist: Murvin Natal, MD Preanesthetic Checklist Completed: patient identified, IV checked, risks and benefits discussed, surgical consent, monitors and equipment checked, pre-op evaluation and timeout performed Spinal Block Patient position: sitting Prep: DuraPrep Patient monitoring: cardiac monitor, continuous pulse ox and blood pressure Approach: midline Location: L4-5 Injection technique: single-shot Needle Needle type: Pencan  Needle gauge: 24 G Needle length: 9 cm Assessment Sensory level: T10 Additional Notes Functioning IV was confirmed and monitors were applied. Sterile prep and drape, including hand hygiene and sterile gloves were used. The patient was positioned and the spine was prepped. The skin was anesthetized with lidocaine.  Previous unsuccessful attempt by CRNA. Free flow of clear CSF was obtained prior to injecting local anesthetic into the CSF.  The spinal needle aspirated freely following injection.  The needle was carefully withdrawn.  The patient tolerated the procedure well.

## 2020-05-13 NOTE — Plan of Care (Signed)
Plan of care discussed.   

## 2020-05-14 ENCOUNTER — Encounter (HOSPITAL_COMMUNITY): Payer: Self-pay | Admitting: Orthopedic Surgery

## 2020-05-14 DIAGNOSIS — M1711 Unilateral primary osteoarthritis, right knee: Secondary | ICD-10-CM | POA: Diagnosis not present

## 2020-05-14 LAB — CBC
HCT: 32.5 % — ABNORMAL LOW (ref 39.0–52.0)
Hemoglobin: 10.7 g/dL — ABNORMAL LOW (ref 13.0–17.0)
MCH: 31.5 pg (ref 26.0–34.0)
MCHC: 32.9 g/dL (ref 30.0–36.0)
MCV: 95.6 fL (ref 80.0–100.0)
Platelets: 182 10*3/uL (ref 150–400)
RBC: 3.4 MIL/uL — ABNORMAL LOW (ref 4.22–5.81)
RDW: 13.4 % (ref 11.5–15.5)
WBC: 11.5 10*3/uL — ABNORMAL HIGH (ref 4.0–10.5)
nRBC: 0 % (ref 0.0–0.2)

## 2020-05-14 LAB — GLUCOSE, CAPILLARY
Glucose-Capillary: 172 mg/dL — ABNORMAL HIGH (ref 70–99)
Glucose-Capillary: 172 mg/dL — ABNORMAL HIGH (ref 70–99)

## 2020-05-14 LAB — BASIC METABOLIC PANEL
Anion gap: 13 (ref 5–15)
BUN: 22 mg/dL (ref 8–23)
CO2: 22 mmol/L (ref 22–32)
Calcium: 8.5 mg/dL — ABNORMAL LOW (ref 8.9–10.3)
Chloride: 103 mmol/L (ref 98–111)
Creatinine, Ser: 1.06 mg/dL (ref 0.61–1.24)
GFR calc Af Amer: >60 mL/min (ref >60)
GFR calc non Af Amer: >60 mL/min (ref >60)
Glucose, Bld: 164 mg/dL — ABNORMAL HIGH (ref 70–99)
Potassium: 4.6 mmol/L (ref 3.5–5.1)
Sodium: 138 mmol/L (ref 135–145)

## 2020-05-14 MED ORDER — MELOXICAM 7.5 MG PO TABS: 7.5000 mg | ORAL_TABLET | Freq: Every day | ORAL | 1 refills | Status: AC

## 2020-05-14 MED ORDER — HYDROCODONE-ACETAMINOPHEN 5-325 MG PO TABS: 1.0000 | ORAL_TABLET | ORAL | 0 refills | Status: AC | PRN

## 2020-05-14 MED ORDER — DOCUSATE SODIUM 100 MG PO CAPS: 100.0000 mg | ORAL_CAPSULE | Freq: Two times a day (BID) | ORAL | 1 refills | Status: AC

## 2020-05-14 MED ORDER — ONDANSETRON HCL 4 MG PO TABS: 4.0000 mg | ORAL_TABLET | Freq: Four times a day (QID) | ORAL | 0 refills | Status: AC | PRN

## 2020-05-14 MED ORDER — ASPIRIN 81 MG PO CHEW: 81.0000 mg | CHEWABLE_TABLET | Freq: Two times a day (BID) | ORAL | 0 refills | Status: AC

## 2020-05-14 MED ORDER — SENNA 8.6 MG PO TABS: 2.0000 | ORAL_TABLET | Freq: Every day | ORAL | 0 refills | Status: AC

## 2020-05-14 NOTE — Discharge Summary (Signed)
Physician Discharge Summary  Patient ID: Ethan Goodman. MRN: [id number] DOB/AGE: January 23, 1946 74 y.o.  Admit date: 05/13/2020 Discharge date: 05/14/2020  Admission Diagnoses:  Osteoarthritis of right knee  Discharge Diagnoses:  Principal Problem:   Osteoarthritis of right knee   Past Medical History:  Diagnosis Date   Acute respiratory failure due to COVID-19 [facility])    CHF (congestive heart failure) (HCC)    Degenerative disc disease, lumbar    Diabetes mellitus type 2 in obese [facility])    Diverticulosis    Erectile dysfunction    High cholesterol    Hypertension    Hypertriglyceridemia without hypercholesterolemia    Last noted cholesterol the total showing 70, HDL 50, LDL unable to be calculated due to triglycerides of 10/18/1997   Low testosterone    On replacement supplement   Obesity (BMI 30-39.9)    OSA on CPAP 09/24/2012   AHI of 78.1 --previously followed by Dr. Claiborne Billings, last visit February 2014 -- marked improvement in daytime sleepiness. Able to sleep through the night   Stomach problems    Venous insufficiency of both lower extremities     Surgeries: Procedure(s): COMPUTER ASSISTED TOTAL KNEE ARTHROPLASTY on 05/13/2020   Consultants (if any):   Discharged Condition: Improved  Hospital Course: Rehan Holness. is an 74 y.o. male who was admitted 05/13/2020 with a diagnosis of Osteoarthritis of right knee and went to the operating room on 05/13/2020 and underwent the above named procedures.    He was given perioperative antibiotics:  Anti-infectives (From admission, onward)   Start     Dose/Rate Route Frequency Ordered Stop   05/13/20 1700  ceFAZolin (ANCEF) IVPB 2g/100 mL premix        2 g 200 mL/hr over 30 Minutes Intravenous Every 6 hours 05/13/20 1656 05/14/20 0459   05/13/20 1614  ceFAZolin (ANCEF) 2-4 GM/100ML-% IVPB       Note to Pharmacy: Nash Dimmer   : cabinet override      05/13/20 1614 05/13/20 1653   05/13/20 1100  ceFAZolin  (ANCEF) IVPB 2g/100 mL premix        2 g 200 mL/hr over 30 Minutes Intravenous On call to O.R. 05/13/20 1047 05/13/20 1723    .  He was given sequential compression devices, early ambulation, and ASA for DVT prophylaxis.  He benefited maximally from the hospital stay and there were no complications.    Recent vital signs:  Vitals:   05/14/20 0050 05/14/20 0522  BP: (!) 129/66 (!) 140/84  Pulse: 62 63  Resp: 18 18  Temp: 98.3 F (36.8 C) 98.1 F (36.7 C)  SpO2: 99% 99%    Recent laboratory studies:  Lab Results  Component Value Date   HGB 10.7 (L) 05/14/2020   HGB 11.6 (L) 05/13/2020   HGB 12.4 (L) 05/04/2020   Lab Results  Component Value Date   WBC 11.5 (H) 05/14/2020   PLT 182 05/14/2020   Lab Results  Component Value Date   INR 1.0 05/04/2020   Lab Results  Component Value Date   NA 138 05/14/2020   K 4.6 05/14/2020   CL 103 05/14/2020   CO2 22 05/14/2020   BUN 22 05/14/2020   CREATININE 1.06 05/14/2020   GLUCOSE 164 (H) 05/14/2020    Discharge Medications:   Allergies as of 05/14/2020      Reactions   Lorazepam Other (See Comments)   Hallucination    Morphine And Related Other (See Comments)   Hallucinations  Medication List    TAKE these medications   aspirin 81 MG chewable tablet Chew 1 tablet (81 mg total) by mouth 2 (two) times daily.   citalopram 40 MG tablet Commonly known as: CELEXA Take 40 mg by mouth daily.   docusate sodium 100 MG capsule Commonly known as: COLACE Take 1 capsule (100 mg total) by mouth 2 (two) times daily.   doxazosin 4 MG tablet Commonly known as: CARDURA Take 4 mg by mouth at bedtime.   finasteride 5 MG tablet Commonly known as: PROSCAR Take 5 mg by mouth daily.   glipiZIDE 5 MG tablet Commonly known as: GLUCOTROL Take 5 mg by mouth 2 (two) times daily before a meal.   HYDROcodone-acetaminophen 5-325 MG tablet Commonly known as: NORCO/VICODIN Take 1 tablet by mouth every 4 (four) hours as needed  for moderate pain (pain score 4-6).   Melatonin 10 MG Tabs Take 10 mg by mouth daily.   meloxicam 7.5 MG tablet Commonly known as: MOBIC Take 1 tablet (7.5 mg total) by mouth daily.   metFORMIN 500 MG tablet Commonly known as: GLUCOPHAGE Take 500 mg by mouth 2 (two) times daily with a meal.   multivitamin with minerals tablet Take 1 tablet by mouth daily.   NON FORMULARY C-PAP at bedtime   ondansetron 4 MG tablet Commonly known as: ZOFRAN Take 1 tablet (4 mg total) by mouth every 6 (six) hours as needed for nausea.   senna 8.6 MG Tabs tablet Commonly known as: SENOKOT Take 2 tablets (17.2 mg total) by mouth at bedtime.   tamsulosin 0.4 MG Caps capsule Commonly known as: FLOMAX Take 0.4 mg by mouth daily after supper.   vitamin B-12 1000 MCG tablet Commonly known as: CYANOCOBALAMIN Take 2,000 mcg by mouth daily.       Diagnostic Studies: DG Knee Right Port  Result Date: 05/13/2020 CLINICAL DATA:  74 year old male status post right knee arthroplasty. EXAM: PORTABLE RIGHT KNEE - 1-2 VIEW COMPARISON:  None. FINDINGS: Portable AP and cross-table lateral views. Right total knee arthroplasty hardware in place, appears intact. Alignment appears satisfactory. No unexpected osseous changes identified. There is soft tissue swelling and gas in an around the joint space. Superimposed vascular calcifications. IMPRESSION: Right total knee arthroplasty with no adverse features. Electronically Signed   By: Genevie Ann M.D.   On: 05/13/2020 18:51    Disposition: Discharge disposition: 01-Home or Self Care       Discharge Instructions    Call MD / Call 911   Complete by: As directed    If you experience chest pain or shortness of breath, CALL 911 and be transported to the hospital emergency room.  If you develope a fever above 101 F, pus (white drainage) or increased drainage or redness at the wound, or calf pain, call your surgeon's office.   Constipation Prevention   Complete by: As  directed    Drink plenty of fluids.  Prune juice may be helpful.  You may use a stool softener, such as Colace (over the counter) 100 mg twice a day.  Use MiraLax (over the counter) for constipation as needed.   Do not put a pillow under the knee. Place it under the heel.   Complete by: As directed    Driving restrictions   Complete by: As directed    No driving for 6 weeks   Increase activity slowly as tolerated   Complete by: As directed    TED hose   Complete by: As directed  Use stockings (TED hose) for 2 weeks on both leg(s).  You may remove them at night for sleeping.         Signed: Hilton Cork Hanh Kertesz 05/14/2020, 8:36 AM

## 2020-05-14 NOTE — TOC Transition Note (Signed)
Transition of Care [facility]) - CM/SW Discharge Note   Patient Details  Name: Ethan Goodman. MRN: [id number] Date of Birth: 04-Jun-1946  Transition of Care [facility]) CM/SW Contact:  Lia Hopping, LCSW Phone Number: 05/14/2020, 9:19 AM   Clinical Narrative:    Therapy Plan: OPPT RW and 3 in1 delivered by [employer] to the pt. Bedside.     Final next level of care: OP Rehab Barriers to Discharge: No Barriers Identified   Patient Goals and CMS Choice     Choice offered to / list presented to : NA  Discharge Placement                       Discharge Plan and Services                DME Arranged: 3-N-1, Walker rolling DME Agency: [employer] Date DME Agency Contacted: 05/14/20 Time DME Agency Contacted: 0900 Representative spoke with at DME Agency: [place]: NA        Social Determinants of Health ([place]) Interventions     Readmission Risk Interventions No flowsheet data found.

## 2020-05-14 NOTE — Progress Notes (Addendum)
° ° °  Subjective:  Patient reports pain as mild to moderate.  Denies N/V/CP/SOB. 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 from previous lumbar surgery   Lab Results  Component Value Date   WBC 11.5 (H) 05/14/2020   HGB 10.7 (L) 05/14/2020   HCT 32.5 (L) 05/14/2020   MCV 95.6 05/14/2020   PLT 182 05/14/2020   BMET    Component Value Date/Time   NA 138 05/14/2020 0249   K 4.6 05/14/2020 0249   CL 103 05/14/2020 0249   CO2 22 05/14/2020 0249   GLUCOSE 164 (H) 05/14/2020 0249   BUN 22 05/14/2020 0249   CREATININE 1.06 05/14/2020 0249   CALCIUM 8.5 (L) 05/14/2020 0249   GFRNONAA >60 05/14/2020 0249   GFRAA >60 05/14/2020 0249     Assessment/Plan: 1 Day Post-Op   Principal Problem:   Osteoarthritis of right knee   WBAT with walker DVT ppx: Aspirin, SCDs, TEDS PO pain control PT/OT Dispo: Plan discharge home with outpatient PT     Dorothyann Peng 05/14/2020, 7:54 AM [facility] is now [facility] [address], [place], [place] [address] Phone: [phone] [email or web address] Facebook   [employer]

## 2020-05-14 NOTE — Progress Notes (Signed)
Physical Therapy Treatment Patient Details Name: Ethan Goodman. MRN: [id number] DOB: [birth year] Today's Date: 05/14/2020    History of Present Illness Pt s/p R TKR and with hx of DM, DDD, CHF, and back surgery    PT Comments    Pt progressing well with mobility but requiring cues to slow down for safety.  Pt performed HEP with assist and spouse present to observe - written instruction provided and reviewed.   Follow Up Recommendations  Follow surgeon's recommendation for DC plan and follow-up therapies     Equipment Recommendations  Rolling walker with 5" wheels;3in1 (PT)    Recommendations for Other Services       Precautions / Restrictions Precautions Precautions: Fall;Knee Restrictions Weight Bearing Restrictions: No    Mobility  Bed Mobility               General bed mobility comments: up in chair and requests back to same  Transfers Overall transfer level: Needs assistance Equipment used: Rolling walker (2 wheeled) Transfers: Sit to/from Stand Sit to Stand: Min guard         General transfer comment: cues for LE management and use of UEs to self assist;  Steady assist on rising  Ambulation/Gait Ambulation/Gait assistance: Min guard Gait Distance (Feet): 100 Feet Assistive device: Rolling walker (2 wheeled) Gait Pattern/deviations: Step-to pattern;Decreased step length - right;Decreased step length - left;Shuffle;Trunk flexed Gait velocity: decr   General Gait Details: cues for sequence, posture and position from [employer]             Wheelchair Mobility    Modified Rankin (Stroke Patients Only)       Balance Overall balance assessment: Mild deficits observed, not formally tested                                          Cognition Arousal/Alertness: Awake/alert Behavior During Therapy: WFL for tasks assessed/performed Overall Cognitive Status: Within Functional Limits for tasks assessed                                         Exercises Total Joint Exercises Ankle Circles/Pumps: AROM;Both;15 reps;Supine;AAROM Quad Sets: AROM;Both;10 reps;Supine Heel Slides: AAROM;Right;15 reps;Supine Straight Leg Raises: AAROM;AROM;Right;10 reps;Supine Long Arc Quad: AROM;Right;10 reps;Seated Knee Flexion: AAROM;Right;10 reps;Seated    General Comments        Pertinent Vitals/Pain Pain Assessment: 0-10 Pain Score: 3  Pain Location: R knee Pain Descriptors / Indicators: Aching;Sore Pain Intervention(s): Limited activity within patient's tolerance;Monitored during session;Premedicated before session    [place] expects to be discharged to:: Private residence Living Arrangements: Spouse/significant other Available Help at Discharge: Family Type of Home: House Home Access: Ramped entrance   [place]: One level [place]: None;Cane - single point      Prior Function Level of Independence: Independent with assistive device(s)      Comments: Pt walked with two canes   PT Goals (current goals can now be found in the care plan section) Acute Rehab PT Goals Patient Stated Goal: Regain IND PT Goal Formulation: With patient Time For Goal Achievement: 05/21/20 Potential to Achieve Goals: Good Progress towards PT goals: Progressing toward goals    Frequency    7X/week      PT Plan Current plan remains appropriate  Co-evaluation              AM-PAC PT "6 Clicks" Mobility   Outcome Measure  Help needed turning from your back to your side while in a flat bed without using bedrails?: A Little Help needed moving from lying on your back to sitting on the side of a flat bed without using bedrails?: A Little Help needed moving to and from a bed to a chair (including a wheelchair)?: A Little Help needed standing up from a chair using your arms (e.g., wheelchair or bedside chair)?: A Little Help needed to walk in hospital room?: A Little Help  needed climbing 3-5 steps with a railing? : A Little 6 Click Score: 18    End of Session Equipment Utilized During Treatment: Gait belt Activity Tolerance: Patient tolerated treatment well Patient left: in chair;with call bell/phone within reach;with family/visitor present Nurse Communication: Mobility status PT Visit Diagnosis: Difficulty in walking, not elsewhere classified (R26.2)     Time: 4709-2957 PT Time Calculation (min) (ACUTE ONLY): 34 min  Charges:  $Gait Training: 8-22 mins $Therapeutic Exercise: 8-22 mins                     [place] Pager [phone] Office [phone]    Lalania Haseman 05/14/2020, 3:40 PM

## 2020-05-14 NOTE — Evaluation (Signed)
Physical Therapy Evaluation Patient Details Name: Ethan Goodman. MRN: [id number] DOB: 1946/06/08 Today's Date: 05/14/2020   History of Present Illness  Pt s/p R TKR and with hx of DM, DDD, CHF, and back surgery  Clinical Impression  Pt s/p R TKR and presents with decreased R LE strength/ROM and post op pain limiting functional mobility.  Pt should progress to dc home with family assist and has firt OP PT scheduled for Tuesday 05/18/20.    Follow Up Recommendations Follow surgeon's recommendation for DC plan and follow-up therapies    Equipment Recommendations  Rolling walker with 5" wheels;3in1 (PT)    Recommendations for Other Services       Precautions / Restrictions Precautions Precautions: Fall;Knee Restrictions Weight Bearing Restrictions: No      Mobility  Bed Mobility               General bed mobility comments: up in chair and requests back to same  Transfers Overall transfer level: Needs assistance Equipment used: Rolling walker (2 wheeled) Transfers: Sit to/from Stand Sit to Stand: Min assist         General transfer comment: cues for LE management and use of UEs to self assist;  Steady assist on rising  Ambulation/Gait Ambulation/Gait assistance: Min assist;Min guard Gait Distance (Feet): 75 Feet Assistive device: Rolling walker (2 wheeled) Gait Pattern/deviations: Step-to pattern;Decreased step length - right;Decreased step length - left;Shuffle;Trunk flexed Gait velocity: decr   General Gait Details: cues for sequence, posture and position from [employer]            Wheelchair Mobility    Modified Rankin (Stroke Patients Only)       Balance Overall balance assessment: Mild deficits observed, not formally tested                                           Pertinent Vitals/Pain Pain Assessment: 0-10 Pain Score: 3  Pain Location: R knee Pain Descriptors / Indicators: Aching;Sore Pain Intervention(s):  Limited activity within patient's tolerance;Monitored during session;Premedicated before session;Ice applied    Home Living Family/patient expects to be discharged to:: Private residence Living Arrangements: Spouse/significant other Available Help at Discharge: Family Type of Home: House Home Access: Ramped entrance     [place]: One [place]: None;Cane - single point      Prior Function Level of Independence: Independent with assistive device(s)         Comments: Pt walked with two canes     Hand Dominance        Extremity/Trunk Assessment   Upper Extremity Assessment Upper Extremity Assessment: Overall WFL for tasks assessed    Lower Extremity Assessment Lower Extremity Assessment: RLE deficits/detail RLE Deficits / Details: 3/5 quads with IND SLR;  AAROM at knee -5 - 80; foot drop 2* prior back surgeries    Cervical / Trunk Assessment Cervical / Trunk Assessment: Normal  Communication   Communication: No difficulties  Cognition Arousal/Alertness: Awake/alert Behavior During Therapy: WFL for tasks assessed/performed Overall Cognitive Status: Within Functional Limits for tasks assessed                                        General Comments      Exercises Total Joint Exercises Ankle Circles/Pumps: AROM;Both;15 reps;Supine;AAROM [employer]  Sets: AROM;Both;10 reps;Supine Heel Slides: AAROM;Right;15 reps;Supine Straight Leg Raises: AAROM;AROM;Right;10 reps;Supine   Assessment/Plan    PT Assessment Patient needs continued PT services  PT Problem List Decreased strength;Decreased range of motion;Decreased activity tolerance;Decreased balance;Decreased mobility;Decreased knowledge of use of DME;Obesity;Pain       PT Treatment Interventions DME instruction;Gait training;Functional mobility training;Therapeutic activities;Therapeutic exercise;Patient/family education    PT Goals (Current goals can be found in the Care Plan section)   Acute Rehab PT Goals Patient Stated Goal: Regain IND PT Goal Formulation: With patient Time For Goal Achievement: 05/21/20 Potential to Achieve Goals: Good    Frequency 7X/week   Barriers to discharge        Co-evaluation               AM-PAC PT "6 Clicks" Mobility  Outcome Measure Help needed turning from your back to your side while in a flat bed without using bedrails?: A Little Help needed moving from lying on your back to sitting on the side of a flat bed without using bedrails?: A Little Help needed moving to and from a bed to a chair (including a wheelchair)?: A Little Help needed standing up from a chair using your arms (e.g., wheelchair or bedside chair)?: A Little Help needed to walk in hospital room?: A Little Help needed climbing 3-5 steps with a railing? : A Little 6 Click Score: 18    End of Session Equipment Utilized During Treatment: Gait belt Activity Tolerance: Patient tolerated treatment well Patient left: in chair;with call bell/phone within reach;with family/visitor present Nurse Communication: Mobility status PT Visit Diagnosis: Difficulty in walking, not elsewhere classified (R26.2)    Time: 4210-3128 PT Time Calculation (min) (ACUTE ONLY): 33 min   Charges:   PT Evaluation $PT Eval Low Complexity: 1 Low PT Treatments $Therapeutic Exercise: 8-22 mins        [place] Pager [phone] Office [phone]   Oma Alpert 05/14/2020, 1:57 PM

## 2020-05-17 LAB — BPAM RBC
Blood Product Expiration Date: 202108292359
Blood Product Expiration Date: 202108292359
Blood Product Expiration Date: 202108292359
Blood Product Expiration Date: 202109012359
Unit Type and Rh: 5100
Unit Type and Rh: 5100
Unit Type and Rh: 5100
Unit Type and Rh: 5100

## 2020-05-17 LAB — TYPE AND SCREEN
ABO/RH(D): O POS
Antibody Screen: NEGATIVE
Unit division: 0
Unit division: 0
Unit division: 0
Unit division: 0

## 2021-10-14 ENCOUNTER — Emergency Department (HOSPITAL_BASED_OUTPATIENT_CLINIC_OR_DEPARTMENT_OTHER)
Admission: EM | Admit: 2021-10-14 | Discharge: 2021-10-14 | Disposition: A | Discharge status: Home / Self Care | Payer: Medicare Other | Source: Home / Self Care

## 2021-10-14 ENCOUNTER — Other Ambulatory Visit: Payer: Self-pay
